# Patient Record
Sex: Female | Born: 1980 | Race: Black or African American | Hispanic: No | Marital: Single | Smoking: Never smoker
Health system: Southern US, Community
[De-identification: ages and names within clinical notes are randomized; demographics above are authoritative.]

## PROBLEM LIST (undated history)

## (undated) ENCOUNTER — Inpatient Hospital Stay (HOSPITAL_COMMUNITY): Payer: Self-pay

## (undated) DIAGNOSIS — D219 Benign neoplasm of connective and other soft tissue, unspecified: Secondary | ICD-10-CM

## (undated) HISTORY — PX: NO PAST SURGERIES: SHX2092

---

## 2012-07-30 ENCOUNTER — Emergency Department (HOSPITAL_COMMUNITY)
Admission: EM | Admit: 2012-07-30 | Discharge: 2012-07-31 | Disposition: A | Discharge status: Home / Self Care | Payer: Medicaid Other | Attending: Emergency Medicine | Admitting: Emergency Medicine

## 2012-07-30 ENCOUNTER — Encounter (HOSPITAL_COMMUNITY): Payer: Self-pay | Admitting: *Deleted

## 2012-07-30 DIAGNOSIS — O468X9 Other antepartum hemorrhage, unspecified trimester: Secondary | ICD-10-CM | POA: Insufficient documentation

## 2012-07-30 DIAGNOSIS — N939 Abnormal uterine and vaginal bleeding, unspecified: Secondary | ICD-10-CM

## 2012-07-30 DIAGNOSIS — O9989 Other specified diseases and conditions complicating pregnancy, childbirth and the puerperium: Secondary | ICD-10-CM | POA: Insufficient documentation

## 2012-07-30 DIAGNOSIS — Z3490 Encounter for supervision of normal pregnancy, unspecified, unspecified trimester: Secondary | ICD-10-CM

## 2012-07-30 DIAGNOSIS — R109 Unspecified abdominal pain: Secondary | ICD-10-CM | POA: Insufficient documentation

## 2012-07-30 DIAGNOSIS — R82998 Other abnormal findings in urine: Secondary | ICD-10-CM | POA: Insufficient documentation

## 2012-07-30 DIAGNOSIS — R8271 Bacteriuria: Secondary | ICD-10-CM

## 2012-07-30 DIAGNOSIS — Z3201 Encounter for pregnancy test, result positive: Secondary | ICD-10-CM | POA: Insufficient documentation

## 2012-07-30 LAB — CBC WITH DIFFERENTIAL/PLATELET
HCT: 33.4 % — ABNORMAL LOW (ref 36.0–46.0)
Hemoglobin: 11.9 g/dL — ABNORMAL LOW (ref 12.0–15.0)
Lymphs Abs: 2.2 10*3/uL (ref 0.7–4.0)
MCHC: 35.6 g/dL (ref 30.0–36.0)
Monocytes Absolute: 0.6 10*3/uL (ref 0.1–1.0)
Monocytes Relative: 10 % (ref 3–12)
Neutro Abs: 3.7 10*3/uL (ref 1.7–7.7)
Neutrophils Relative %: 57 % (ref 43–77)
RBC: 3.79 MIL/uL — ABNORMAL LOW (ref 3.87–5.11)

## 2012-07-30 LAB — URINALYSIS, ROUTINE W REFLEX MICROSCOPIC
Glucose, UA: NEGATIVE mg/dL
Ketones, ur: NEGATIVE mg/dL
Nitrite: NEGATIVE
Protein, ur: NEGATIVE mg/dL
Urobilinogen, UA: 1 mg/dL (ref 0.0–1.0)

## 2012-07-30 LAB — COMPREHENSIVE METABOLIC PANEL
Alkaline Phosphatase: 58 U/L (ref 39–117)
BUN: 7 mg/dL (ref 6–23)
CO2: 23 mEq/L (ref 19–32)
Chloride: 104 mEq/L (ref 96–112)
Creatinine, Ser: 0.64 mg/dL (ref 0.50–1.10)
GFR calc Af Amer: >90 mL/min (ref >90)
GFR calc non Af Amer: >90 mL/min (ref >90)
Glucose, Bld: 99 mg/dL (ref 70–99)
Potassium: 3.6 mEq/L (ref 3.5–5.1)
Total Bilirubin: 0.2 mg/dL — ABNORMAL LOW (ref 0.3–1.2)

## 2012-07-30 LAB — URINE MICROSCOPIC-ADD ON

## 2012-07-30 NOTE — ED Notes (Signed)
The pt has had a cold and cough for 2 weeks and she has also had some minor vaginal spotting for 2 weeks.  lmp oct

## 2012-07-31 ENCOUNTER — Emergency Department (HOSPITAL_COMMUNITY): Payer: Medicaid Other

## 2012-07-31 LAB — ABO/RH: ABO/RH(D): O POS

## 2012-07-31 MED ORDER — NITROFURANTOIN MONOHYD MACRO 100 MG PO CAPS: 100.0000 mg | ORAL_CAPSULE | Freq: Two times a day (BID) | ORAL | Status: DC

## 2012-07-31 MED ORDER — ACETAMINOPHEN 500 MG PO TABS
1000.0000 mg | ORAL_TABLET | Freq: Once | ORAL | Status: AC
  Administered 2012-07-31: 1000 mg via ORAL
  Filled 2012-07-31: qty 2

## 2012-07-31 MED ORDER — ONDANSETRON 4 MG PO TBDP: 4.0000 mg | ORAL_TABLET | Freq: Three times a day (TID) | ORAL | Status: DC | PRN

## 2012-07-31 NOTE — ED Notes (Signed)
Patient transported to Ultrasound with tech

## 2012-07-31 NOTE — ED Provider Notes (Signed)
History     CSN: [id number]  Arrival date & time 07/30/12  2028   First MD Initiated Contact with Patient 07/30/12 2351      Chief Complaint  Patient presents with  . multiple symptoms     (Consider location/radiation/quality/duration/timing/severity/associated sxs/prior treatment) HPI Comments: 31 year old female who is G3 P2 at approximately 6 weeks by dates of last menstrual period presents with lower abdominal cramping, vaginal spotting, intermittent productive cough and a headache. The symptoms have been gradually getting worse over the last 2 weeks, not associated with diarrhea swelling of the legs rashes sore throat nasal congestion sinus pressure ear aches. Nothing seems to make it better or worse. Her 2 prior pregnancies were normal and resulted in vaginal deliveries of healthy children. She was unaware that she was pregnant on her arrival to  The history is provided by the patient and a friend.    History reviewed. No pertinent past medical history.  History reviewed. No pertinent past surgical history.  No family history on file.  History  Substance Use Topics  . Smoking status: Never Smoker   . Smokeless tobacco: Not on file  . Alcohol Use: No    OB History    Grav Para Term Preterm Abortions TAB SAB Ect Mult Living                  Review of Systems  All other systems reviewed and are negative.    Allergies  Review of patient's allergies indicates no known allergies.  Home Medications   Current Outpatient Rx  Name  Route  Sig  Dispense  Refill  . PSEUDOEPH-CPM-DM-APAP 30-2-15-325 MG PO TABS   Oral   Take 1-2 tablets by mouth every 8 (eight) hours as needed. Cold symptoms         . NITROFURANTOIN MONOHYD MACRO 100 MG PO CAPS   Oral   Take 1 capsule (100 mg total) by mouth 2 (two) times daily.   10 capsule   0   . ONDANSETRON 4 MG PO TBDP   Oral   Take 1 tablet (4 mg total) by mouth every 8 (eight) hours as needed for nausea.   10 tablet   0     BP 134/87  Pulse 73  Temp 99.1 F (37.3 C) (Oral)  Resp 14  SpO2 100%  LMP 05/30/2012  Physical Exam  Nursing note and vitals reviewed. Constitutional: She appears well-developed and well-nourished. No distress.  HENT:  Head: Normocephalic and atraumatic.  Mouth/Throat: Oropharynx is clear and moist. No oropharyngeal exudate.  Eyes: Conjunctivae normal and EOM are normal. Pupils are equal, round, and reactive to light. Right eye exhibits no discharge. Left eye exhibits no discharge. No scleral icterus.  Neck: Normal range of motion. Neck supple. No JVD present. No thyromegaly present.  Cardiovascular: Normal rate, regular rhythm, normal heart sounds and intact distal pulses.  Exam reveals no gallop and no friction rub.   No murmur heard. Pulmonary/Chest: Effort normal and breath sounds normal. No respiratory distress. She has no wheezes. She has no rales.  Abdominal: Soft. Bowel sounds are normal. She exhibits no distension and no mass. There is tenderness ( Mild suprapubic and left lower quadrant tenderness, no guarding, no masses, no other tenderness, no peritoneal signs).  Musculoskeletal: Normal range of motion. She exhibits no edema and no tenderness.  Lymphadenopathy:    She has no cervical adenopathy.  Neurological: She is alert. Coordination normal.  Skin: Skin is warm and dry. No rash  noted. No erythema.  Psychiatric: She has a normal mood and affect. Her behavior is normal.    ED Course  Procedures (including critical care time)  Labs Reviewed  CBC WITH DIFFERENTIAL - Abnormal; Notable for the following:    RBC 3.79 (*)     Hemoglobin 11.9 (*)     HCT 33.4 (*)     All other components within normal limits  COMPREHENSIVE METABOLIC PANEL - Abnormal; Notable for the following:    Total Bilirubin 0.2 (*)     All other components within normal limits  URINALYSIS, ROUTINE W REFLEX MICROSCOPIC - Abnormal; Notable for the following:    APPearance CLOUDY (*)      Specific Gravity, Urine 1.034 (*)     Leukocytes, UA MODERATE (*)     All other components within normal limits  PREGNANCY, URINE - Abnormal; Notable for the following:    Preg Test, Ur POSITIVE (*)     All other components within normal limits  URINE MICROSCOPIC-ADD ON - Abnormal; Notable for the following:    Squamous Epithelial / LPF MANY (*)     Bacteria, UA MANY (*)     All other components within normal limits  ABO/RH  URINE CULTURE   US Ob Comp Less 14 Wks  07/31/2012  *RADIOLOGY REPORT*  Clinical Data: Vaginal bleeding.  Estimated gestational age by LMP is 6 weeks 3 days.  Quantitative beta HCG is not available.  OBSTETRIC <14 WK [place] AND TRANSVAGINAL OB US  Technique:  Both transabdominal and transvaginal ultrasound examinations were performed for complete evaluation of the gestation as well as the maternal uterus, adnexal regions, and pelvic cul-de-sac.  Transvaginal technique was performed to assess early pregnancy.  Comparison:  None.  Intrauterine gestational sac:  A single intrauterine pregnancy is visualized. Yolk sac: The yolk sac is present. Embryo: Fetal pole is visualized. Cardiac Activity: Fetal cardiac activity is observed. Heart Rate: 111 bpm  CRL: 3.4  mm  6 w  0 d        [place] EDC: 03/26/2013  Maternal uterus/adnexae: Heterogeneous left posterior myometrial mass measuring about 4 cm diameter suggesting a fibroid.  Cysts in the cervical region consistent with Nabothian cysts.  Right ovary measures 4.1 x 2.9 x 1.9 cm.  Left ovary measures 5.4 x 3.6 x 3.2 cm.  Small cyst on the left ovary.  No abnormal adnexal masses.  Small amount of free fluid in the cul-de-sac.  IMPRESSION: Single intrauterine pregnancy.  Estimated gestational age by crown- rump length is 6-week 0 days.  Uterine fibroid.   Original Report Authenticated By: Burman Nieves, M.D.    US Ob Transvaginal  07/31/2012  *RADIOLOGY REPORT*  Clinical Data: Vaginal bleeding.  Estimated gestational age by LMP is 6 weeks 3  days.  Quantitative beta HCG is not available.  OBSTETRIC <14 WK [place] AND TRANSVAGINAL OB US  Technique:  Both transabdominal and transvaginal ultrasound examinations were performed for complete evaluation of the gestation as well as the maternal uterus, adnexal regions, and pelvic cul-de-sac.  Transvaginal technique was performed to assess early pregnancy.  Comparison:  None.  Intrauterine gestational sac:  A single intrauterine pregnancy is visualized. Yolk sac: The yolk sac is present. Embryo: Fetal pole is visualized. Cardiac Activity: Fetal cardiac activity is observed. Heart Rate: 111 bpm  CRL: 3.4  mm  6 w  0 d        [place] EDC: 03/26/2013  Maternal uterus/adnexae: Heterogeneous left posterior myometrial mass measuring about 4  cm diameter suggesting a fibroid.  Cysts in the cervical region consistent with Nabothian cysts.  Right ovary measures 4.1 x 2.9 x 1.9 cm.  Left ovary measures 5.4 x 3.6 x 3.2 cm.  Small cyst on the left ovary.  No abnormal adnexal masses.  Small amount of free fluid in the cul-de-sac.  IMPRESSION: Single intrauterine pregnancy.  Estimated gestational age by crown- rump length is 6-week 0 days.  Uterine fibroid.   Original Report Authenticated By: Burman Nieves, M.D.      1. Pregnancy   2. Abdominal cramping   3. Vaginal bleeding   4. Bacteriuria       MDM  The patient is clear heart and lungs, normal vital signs without fever or tachycardia or hypoxia. She has mild tenderness in her lower abdomen. I performed a bedside ultrasound as a cursory test to evaluate for intrauterine pregnancy, there appears to be a likely intrauterine pregnancy however patient will be sent for formal ultrasound to rule out ectopic pregnancy. She has stable vital signs and a minimally tender exam, Tylenol for pain  The patient appears well. She has no signs of pneumonia, she has lower abdominal cramping with an intrauterine pregnancy that was seen on ultrasound by the ultrasound technician. It has  been red by the radiologist, there is no signs of tubal pregnancy. Urinalysis shows bacteria but no other signs of infection, Macrobid prescribed for bacteriuria and culture obtained.      Vida Roller, MD 07/31/12 0330

## 2012-08-01 ENCOUNTER — Emergency Department (HOSPITAL_COMMUNITY)
Admission: EM | Admit: 2012-08-01 | Discharge: 2012-08-02 | Disposition: A | Discharge status: Home / Self Care | Payer: Medicaid Other | Attending: Emergency Medicine | Admitting: Emergency Medicine

## 2012-08-01 ENCOUNTER — Encounter (HOSPITAL_COMMUNITY): Payer: Self-pay | Admitting: *Deleted

## 2012-08-01 ENCOUNTER — Emergency Department (HOSPITAL_COMMUNITY): Payer: Medicaid Other

## 2012-08-01 DIAGNOSIS — O200 Threatened abortion: Secondary | ICD-10-CM | POA: Insufficient documentation

## 2012-08-01 LAB — CBC WITH DIFFERENTIAL/PLATELET
Basophils Relative: 0 % (ref 0–1)
Eosinophils Absolute: 0 10*3/uL (ref 0.0–0.7)
HCT: 34 % — ABNORMAL LOW (ref 36.0–46.0)
Hemoglobin: 11.8 g/dL — ABNORMAL LOW (ref 12.0–15.0)
MCH: 30.8 pg (ref 26.0–34.0)
MCHC: 34.7 g/dL (ref 30.0–36.0)
Monocytes Absolute: 0.5 10*3/uL (ref 0.1–1.0)
Monocytes Relative: 9 % (ref 3–12)
Neutrophils Relative %: 59 % (ref 43–77)
RDW: 13.1 % (ref 11.5–15.5)

## 2012-08-01 LAB — URINE CULTURE: Colony Count: 50000

## 2012-08-01 NOTE — ED Notes (Signed)
Pt transported to US

## 2012-08-01 NOTE — ED Notes (Addendum)
Pt reports vaginal bleeding and pregnant.  Denies abdominal cramping.  States that she is 6wk4days today.  Denies abdominal injury, fall, any hit to abdomen.  States that she had blood when she wiped.

## 2012-08-01 NOTE — ED Provider Notes (Signed)
History     CSN: [id number]  Arrival date & time 08/01/12  2038   First MD Initiated Contact with Patient 08/01/12 2139      Chief Complaint  Patient presents with  . Vaginal Bleeding  . Routine Prenatal Visit    (Consider location/radiation/quality/duration/timing/severity/associated sxs/prior treatment) HPI Comments: Had episode of vaginal bleeding with some possible tissue in the blood.  Patient is a 31 y.o. female presenting with vaginal bleeding. The history is provided by the patient.  Vaginal Bleeding This is a new problem. The current episode started today. The problem occurs constantly. The problem has been rapidly improving. Pertinent negatives include no abdominal pain, chills, coughing, fever or vomiting. Nothing aggravates the symptoms. She has tried nothing for the symptoms.    History reviewed. No pertinent past medical history.  History reviewed. No pertinent past surgical history.  History reviewed. No pertinent family history.  History  Substance Use Topics  . Smoking status: Never Smoker   . Smokeless tobacco: Not on file  . Alcohol Use: No    OB History    Grav Para Term Preterm Abortions TAB SAB Ect Mult Living                  Review of Systems  Constitutional: Negative for fever and chills.  Respiratory: Negative for cough and shortness of breath.   Gastrointestinal: Negative for vomiting and abdominal pain.  Genitourinary: Positive for vaginal bleeding.  All other systems reviewed and are negative.    Allergies  Review of patient's allergies indicates no known allergies.  Home Medications   Current Outpatient Rx  Name  Route  Sig  Dispense  Refill  . PSEUDOEPH-CPM-DM-APAP 30-2-15-325 MG PO TABS   Oral   Take 1-2 tablets by mouth every 8 (eight) hours as needed. Cold symptoms           BP 127/75  Pulse 82  Temp 98.8 F (37.1 C) (Oral)  Resp 18  SpO2 100%  LMP 05/30/2012  Physical Exam  Nursing note and vitals  reviewed. Constitutional: She is oriented to person, place, and time. She appears well-developed and well-nourished. No distress.  HENT:  Head: Normocephalic and atraumatic.  Eyes: EOM are normal. Pupils are equal, round, and reactive to light.  Neck: Normal range of motion. Neck supple.  Cardiovascular: Normal rate and regular rhythm.  Exam reveals no friction rub.   No murmur heard. Pulmonary/Chest: Effort normal and breath sounds normal. No respiratory distress. She has no wheezes. She has no rales.  Abdominal: Soft. She exhibits no distension. There is no tenderness. There is no rebound.  Genitourinary: No breast swelling or discharge. Pelvic exam was performed with patient supine. Cervix exhibits discharge (mild, mucus-like) and friability (mild redness to cervical with small circular lesions around perimeter). Cervix exhibits no motion tenderness. Right adnexum displays no mass, no tenderness and no fullness. Left adnexum displays no mass, no tenderness and no fullness. No erythema around the vagina. No signs of injury around the vagina. No vaginal discharge found.  Musculoskeletal: Normal range of motion. She exhibits no edema.  Neurological: She is alert and oriented to person, place, and time.  Skin: She is not diaphoretic.    ED Course  Procedures (including critical care time)  Labs Reviewed  CBC WITH DIFFERENTIAL - Abnormal; Notable for the following:    RBC 3.83 (*)     Hemoglobin 11.8 (*)     HCT 34.0 (*)     All other components within  normal limits   US Ob Comp Less 14 Wks  07/31/2012  *RADIOLOGY REPORT*  Clinical Data: Vaginal bleeding.  Estimated gestational age by LMP is 6 weeks 3 days.  Quantitative beta HCG is not available.  OBSTETRIC <14 WK [place] AND TRANSVAGINAL OB US  Technique:  Both transabdominal and transvaginal ultrasound examinations were performed for complete evaluation of the gestation as well as the maternal uterus, adnexal regions, and pelvic cul-de-sac.   Transvaginal technique was performed to assess early pregnancy.  Comparison:  None.  Intrauterine gestational sac:  A single intrauterine pregnancy is visualized. Yolk sac: The yolk sac is present. Embryo: Fetal pole is visualized. Cardiac Activity: Fetal cardiac activity is observed. Heart Rate: 111 bpm  CRL: 3.4  mm  6 w  0 d        [place] EDC: 03/26/2013  Maternal uterus/adnexae: Heterogeneous left posterior myometrial mass measuring about 4 cm diameter suggesting a fibroid.  Cysts in the cervical region consistent with Nabothian cysts.  Right ovary measures 4.1 x 2.9 x 1.9 cm.  Left ovary measures 5.4 x 3.6 x 3.2 cm.  Small cyst on the left ovary.  No abnormal adnexal masses.  Small amount of free fluid in the cul-de-sac.  IMPRESSION: Single intrauterine pregnancy.  Estimated gestational age by crown- rump length is 6-week 0 days.  Uterine fibroid.   Original Report Authenticated By: Burman Nieves, M.D.    US Ob Transvaginal  07/31/2012  *RADIOLOGY REPORT*  Clinical Data: Vaginal bleeding.  Estimated gestational age by LMP is 6 weeks 3 days.  Quantitative beta HCG is not available.  OBSTETRIC <14 WK [place] AND TRANSVAGINAL OB US  Technique:  Both transabdominal and transvaginal ultrasound examinations were performed for complete evaluation of the gestation as well as the maternal uterus, adnexal regions, and pelvic cul-de-sac.  Transvaginal technique was performed to assess early pregnancy.  Comparison:  None.  Intrauterine gestational sac:  A single intrauterine pregnancy is visualized. Yolk sac: The yolk sac is present. Embryo: Fetal pole is visualized. Cardiac Activity: Fetal cardiac activity is observed. Heart Rate: 111 bpm  CRL: 3.4  mm  6 w  0 d        [place] EDC: 03/26/2013  Maternal uterus/adnexae: Heterogeneous left posterior myometrial mass measuring about 4 cm diameter suggesting a fibroid.  Cysts in the cervical region consistent with Nabothian cysts.  Right ovary measures 4.1 x 2.9 x 1.9 cm.  Left ovary  measures 5.4 x 3.6 x 3.2 cm.  Small cyst on the left ovary.  No abnormal adnexal masses.  Small amount of free fluid in the cul-de-sac.  IMPRESSION: Single intrauterine pregnancy.  Estimated gestational age by crown- rump length is 6-week 0 days.  Uterine fibroid.   Original Report Authenticated By: Burman Nieves, M.D.      1. Threatened abortion       MDM   31 year old female presents with vaginal bleeding. Just found out 2 days ago that she had an intrauterine pregnancy confirmed on transvaginal ultrasound. Today she came home from shopping and had episode of vaginal bleeding. Patient says she had material that resembled tissue. She has had the bleeding almost ceased with just very minimal spotting at this time. She does not have any belly pain. She does not have any vaginal discharge. On exam, she has mild mucousy cervical discharge. Her cervix is closed on visual inspection and on manual exam. Patient likely has complete abortion, however cannot rule out threatened miscarriage. Will repeat another transvaginal ultrasound at  this time to confirm if patient still has IUP.  TVUS confirms IUP. Patient instructed to f/u with OB as planned. Patient states she has numbers to call and does not need a referral. Patient discharged home in stable condition.        Elwin Mocha, MD 08/02/12 [phone]

## 2012-08-01 NOTE — ED Notes (Signed)
Pelvic exam preformed

## 2012-08-05 NOTE — ED Provider Notes (Signed)
I saw and evaluated the patient, reviewed the resident's note and I agree with the findings and plan.  Toy Baker, MD 08/05/12 [phone]

## 2012-08-18 NOTE — L&D Delivery Note (Signed)
Delivery Note At 10:08 PM a viable female was delivered via Vaginal, Spontaneous Delivery (Presentation: Left Occiput Anterior).  APGAR: 9, 9; weight .   Placenta status: Intact, Spontaneous.  Cord: 3 vessels with the following complications: None.  C  Anesthesia: None  Episiotomy: None Lacerations: None Suture Repair: none Est. Blood Loss (mL): 300  Mom to postpartum.  Baby to nursery-stable.  Jolyn Lent, RYAN 03/24/2013, 10:40 PM

## 2012-09-01 ENCOUNTER — Other Ambulatory Visit (HOSPITAL_COMMUNITY): Payer: Self-pay | Admitting: Physician Assistant

## 2012-09-01 DIAGNOSIS — Z3682 Encounter for antenatal screening for nuchal translucency: Secondary | ICD-10-CM

## 2012-09-01 LAB — OB RESULTS CONSOLE HEPATITIS B SURFACE ANTIGEN: Hepatitis B Surface Ag: NEGATIVE

## 2012-09-01 LAB — OB RESULTS CONSOLE ABO/RH

## 2012-09-01 LAB — OB RESULTS CONSOLE ANTIBODY SCREEN: Antibody Screen: NEGATIVE

## 2012-09-01 LAB — OB RESULTS CONSOLE RUBELLA ANTIBODY, IGM: Rubella: IMMUNE

## 2012-09-08 ENCOUNTER — Ambulatory Visit (HOSPITAL_COMMUNITY): Payer: Medicaid Other

## 2012-09-16 ENCOUNTER — Ambulatory Visit (HOSPITAL_COMMUNITY)
Admission: RE | Admit: 2012-09-16 | Discharge: 2012-09-16 | Disposition: A | Discharge status: Home / Self Care | Payer: Medicaid Other | Source: Ambulatory Visit | Attending: Physician Assistant | Admitting: Physician Assistant

## 2012-09-16 ENCOUNTER — Encounter (HOSPITAL_COMMUNITY): Payer: Self-pay

## 2012-09-16 ENCOUNTER — Other Ambulatory Visit: Payer: Self-pay

## 2012-09-16 DIAGNOSIS — O351XX0 Maternal care for (suspected) chromosomal abnormality in fetus, not applicable or unspecified: Secondary | ICD-10-CM | POA: Insufficient documentation

## 2012-09-16 DIAGNOSIS — Z3682 Encounter for antenatal screening for nuchal translucency: Secondary | ICD-10-CM

## 2012-09-16 DIAGNOSIS — Z3689 Encounter for other specified antenatal screening: Secondary | ICD-10-CM | POA: Insufficient documentation

## 2012-09-16 DIAGNOSIS — O3510X0 Maternal care for (suspected) chromosomal abnormality in fetus, unspecified, not applicable or unspecified: Secondary | ICD-10-CM | POA: Insufficient documentation

## 2012-09-16 DIAGNOSIS — O3410 Maternal care for benign tumor of corpus uteri, unspecified trimester: Secondary | ICD-10-CM | POA: Insufficient documentation

## 2012-09-16 NOTE — Progress Notes (Signed)
Dominique Clements  was seen today for an ultrasound appointment.  See full report in [facility].  Impression: Single IUP at 13 3/7 weeks NT of 1.5 mm noted.  Nasal bone visualized. Several uterine fibroids noted, as detailed above. First trimester aneuploidy screen performed as noted above.   Recommendations: Please do not draw triple/quad screen, though patient should be offered MSAFP for neural tube defect screening.  Recommend ultrasound for fetal anatomy at approx [redacted] weeks gestation.  Alpha Gula, MD

## 2012-09-29 ENCOUNTER — Other Ambulatory Visit (HOSPITAL_COMMUNITY): Payer: Self-pay | Admitting: Nurse Practitioner

## 2012-09-29 DIAGNOSIS — Z3689 Encounter for other specified antenatal screening: Secondary | ICD-10-CM

## 2012-10-27 ENCOUNTER — Ambulatory Visit (HOSPITAL_COMMUNITY)
Admission: RE | Admit: 2012-10-27 | Discharge: 2012-10-27 | Disposition: A | Discharge status: Home / Self Care | Payer: Medicaid Other | Source: Ambulatory Visit | Attending: Nurse Practitioner | Admitting: Nurse Practitioner

## 2012-10-27 DIAGNOSIS — Z363 Encounter for antenatal screening for malformations: Secondary | ICD-10-CM | POA: Insufficient documentation

## 2012-10-27 DIAGNOSIS — O358XX0 Maternal care for other (suspected) fetal abnormality and damage, not applicable or unspecified: Secondary | ICD-10-CM | POA: Insufficient documentation

## 2012-10-27 DIAGNOSIS — Z1389 Encounter for screening for other disorder: Secondary | ICD-10-CM | POA: Insufficient documentation

## 2012-10-27 DIAGNOSIS — O3410 Maternal care for benign tumor of corpus uteri, unspecified trimester: Secondary | ICD-10-CM | POA: Insufficient documentation

## 2013-03-14 IMAGING — US US OB DETAIL+14 WK
1 series · 12 of 28 positions shown · non-contrast
Comparison: none

[Series 1: us ob detail +14 wk · 12 of 102 slices shown]
[im 4/102]
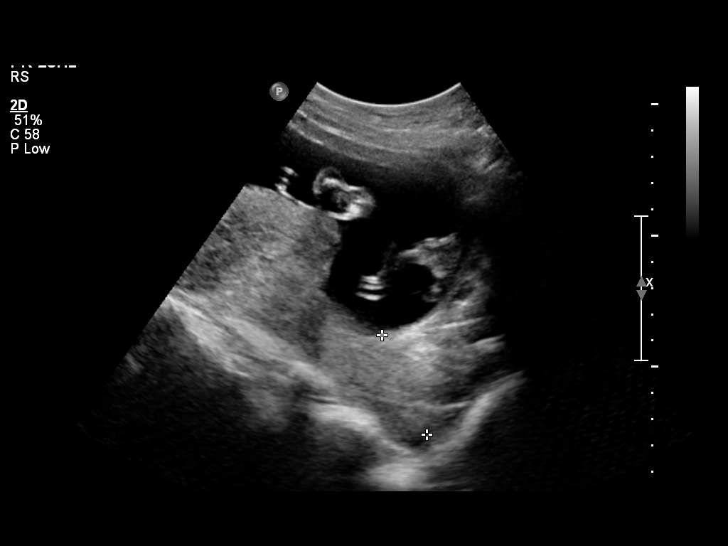
[im 12/102]
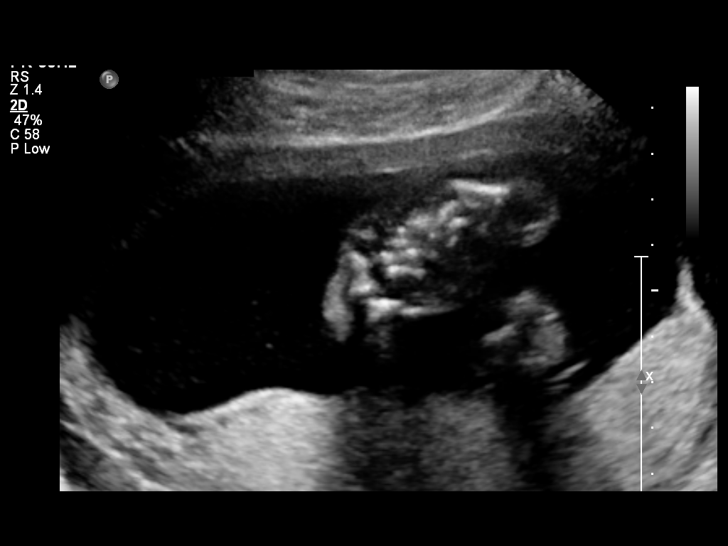
[im 19/102]
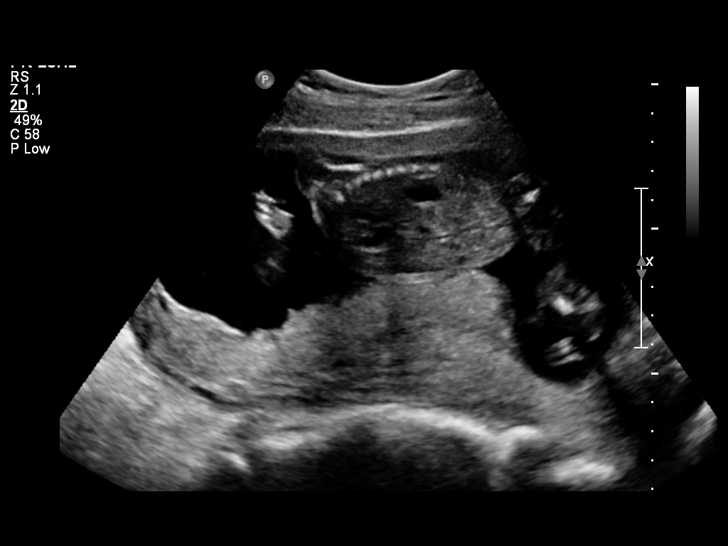
[im 30/102]
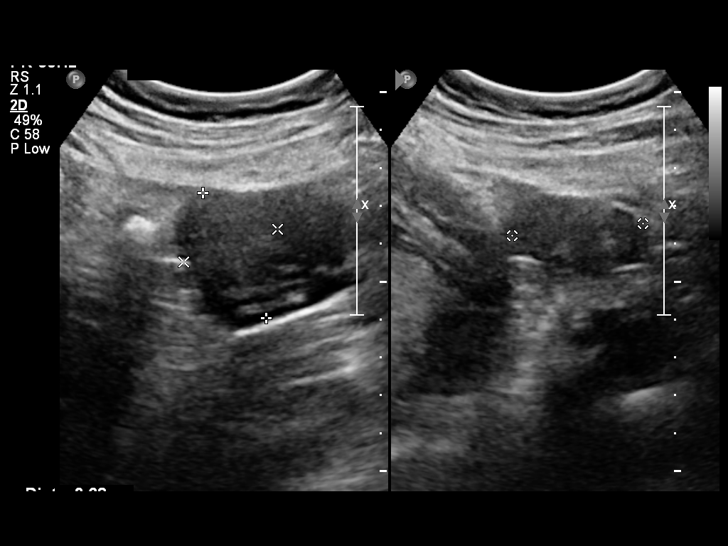
[im 38/102]
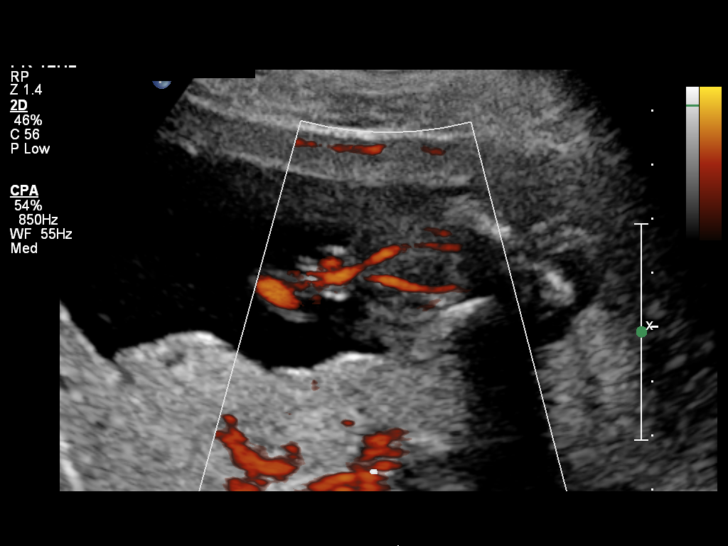
[im 45/102]
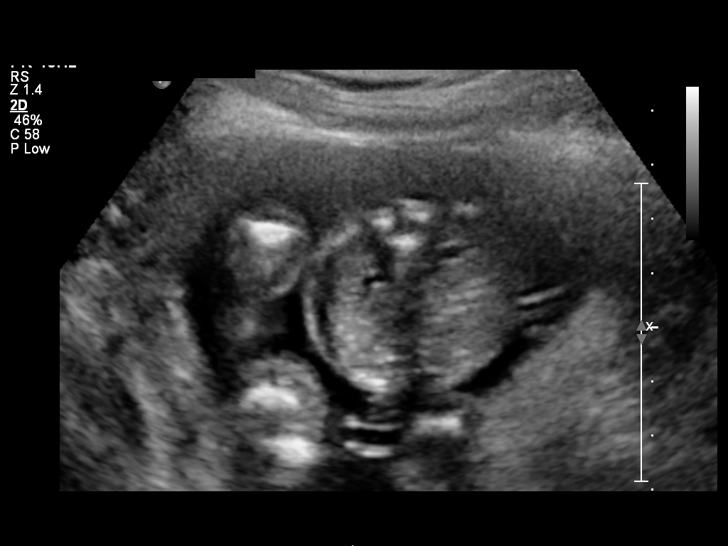
[im 57/102]
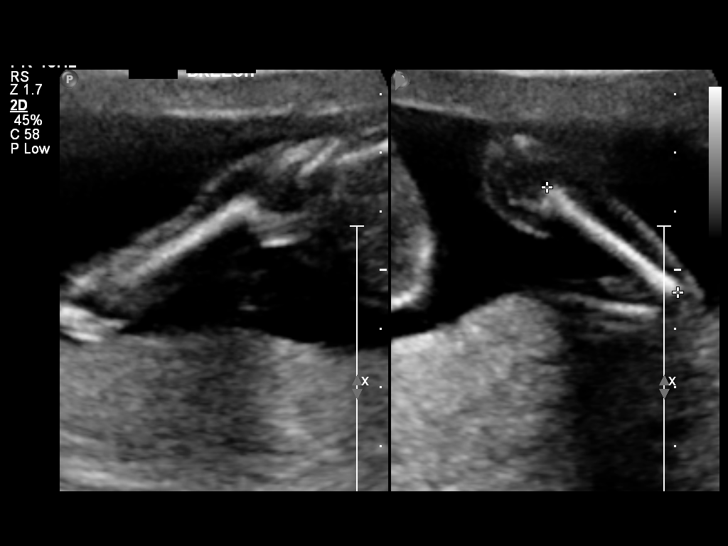
[im 64/102]
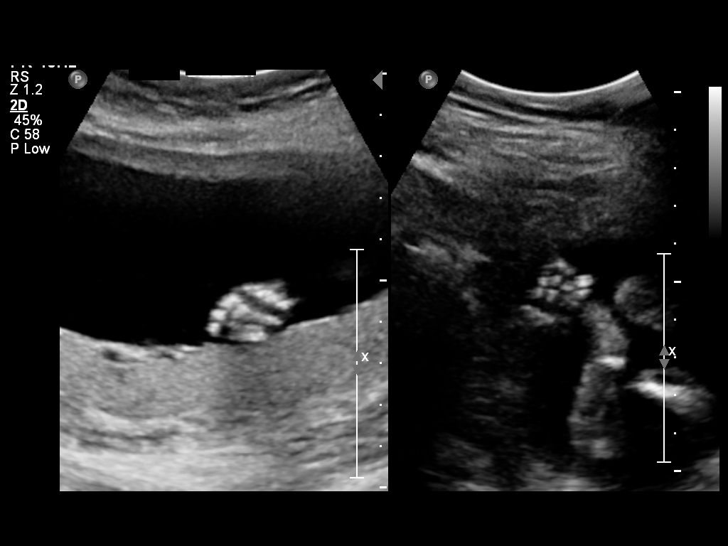
[im 72/102]
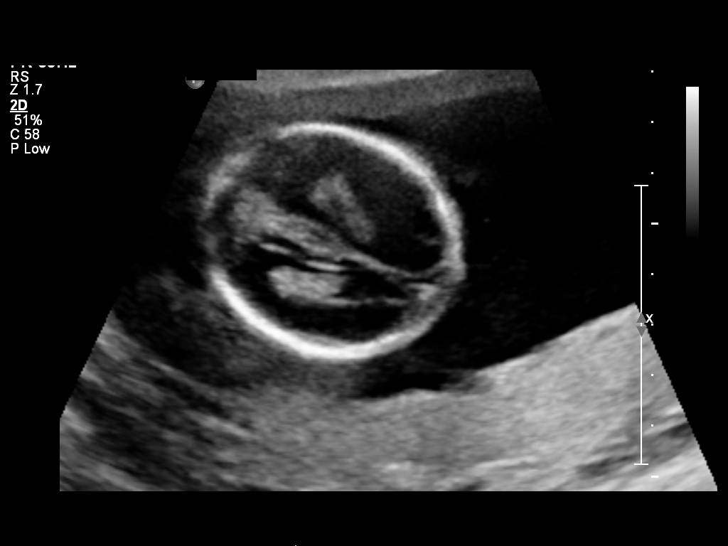
[im 83/102]
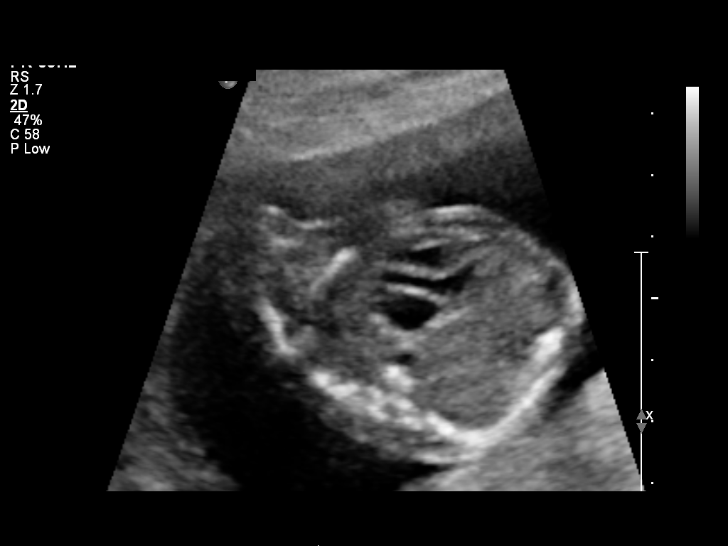
[im 90/102]
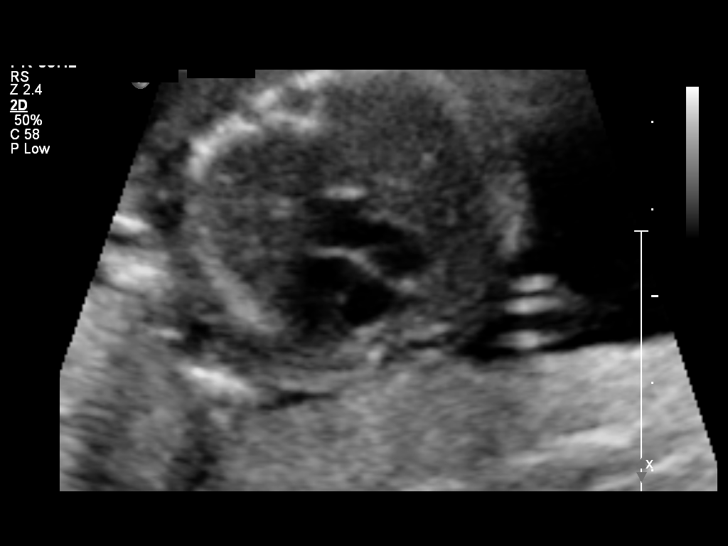
[im 98/102]
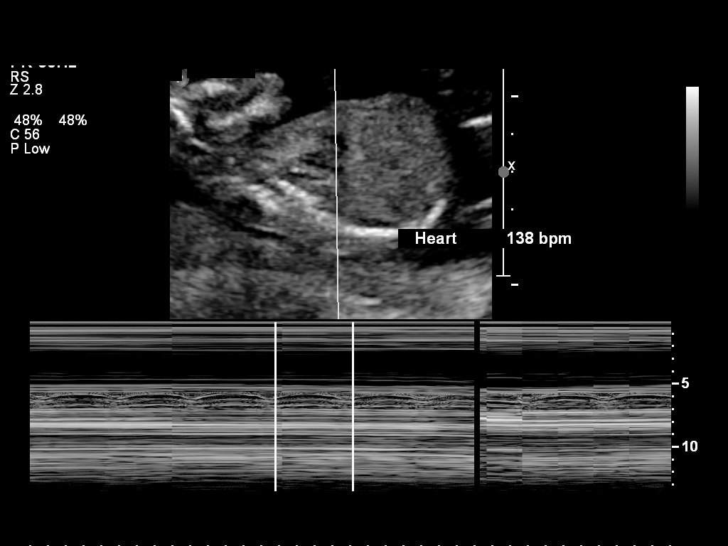

[12 of 28 positions shown; findings below may reference images not displayed]

OBSTETRICS REPORT
                      (Signed Final 10/27/2012 [DATE])

Service(s) Provided

 US OB DETAIL + 14 WK                                  76811.0
Indications

 Detailed fetal anatomic survey
 Uterine fibroids
Fetal Evaluation

 Num Of Fetuses:    1
 Fetal Heart Rate:  138                         bpm
 Cardiac Activity:  Observed
 Presentation:      Breech, footling
 Placenta:          Posterior, above cervical
                    os
 P. Cord            Visualized, central
 Insertion:

 Amniotic Fluid
 AFI FV:      Subjectively within normal limits
                                             Larg Pckt:   4.89   cm
Biometry

 BPD:     43.3  mm    G. Age:   19w 1d                CI:        72.08   70 - 86
                                                      FL/HC:      18.2   16.1 -

 HC:     162.3  mm    G. Age:   19w 0d       30  %    HC/AC:      1.16   1.09 -

 AC:     139.6  mm    G. Age:   19w 3d       48  %    FL/BPD:
 FL:      29.6  mm    G. Age:   19w 1d       37  %    FL/AC:      21.2   20 - 24
 HUM:     29.1  mm    G. Age:   19w 3d       56  %
 CER:     19.3  mm    G. Age:   18w 5d       34  %
 NFT:     4.76  mm

 Est. FW:     280  gm    0 lb 10 oz      44  %
Gestational Age

 LMP:           19w 2d       Date:   06/14/12                 EDD:   03/21/13
 U/S Today:     19w 1d                                        EDD:   03/22/13
 Best:          19w 2d    Det. By:   LMP  (06/14/12)          EDD:   03/21/13
2nd Trimester Genetic Sonogram - Trisomy 21 Screening
 Age:                                             32          Risk=1:   303

 Structural anomalies (inc. cardiac):             No
 Echogenic bowel:                                 No
 Hypoplastic / absent midphalanx 5th Digit:       No
 Pyelectasis:                                     No
 2-vessel umbilical cord:                         No
 Echogenic cardiac foci:                          No
Anatomy

 Cranium:          Appears normal         Aortic Arch:      Appears normal
 Fetal Cavum:      Appears normal         Ductal Arch:      Appears normal
 Ventricles:       Appears normal         Diaphragm:        Appears normal
 Choroid Plexus:   Appears normal         Stomach:          Appears normal
 Cerebellum:       Appears normal         Abdomen:          Appears normal
 Posterior Fossa:  Appears normal         Abdominal Wall:   Appears nml (cord
                                                            insert, abd wall)
 Nuchal Fold:      Appears normal         Cord Vessels:     Appears normal (3
                                                            vessel cord)
 Face:             Appears normal         Kidneys:          Appear normal
                   (orbits and profile)
 Lips:             Appears normal         Bladder:          Appears normal
 Heart:            Appears normal         Spine:            Appears normal
                   (4CH, axis, and
                   situs)
 RVOT:             Appears normal         Lower             Appears normal
                                          Extremities:
 LVOT:             Appears normal         Upper             Appears normal
                                          Extremities:

 Other:  Fetus appears to be a male. Heels and 5th digit visualized. Nasal
         bone visualized.
Targeted Anatomy

 Fetal Central Nervous System
 Lat. Ventricles:  6.9                    Cisterna Magna:
Cervix Uterus Adnexa

 Cervical Length:   3.93      cm

 Cervix:       Normal appearance by transabdominal scan.
 Uterus:       No abnormality visualized.
 Cul De Sac:   No free fluid seen.

 Left Ovary:   Within normal limits.
 Right Ovary:  Within normal limits.
 Adnexa:     No abnormality visualized.
Myomas

 Site                     L(cm)      W(cm)      D(cm)       Location
 Posterior LUS

 Blood Flow                  RI       PI       Comments
Impression

 Assigned GA is currently 19w 2d.   Appropriate interval fetal
 growth.
 No fetal anomalies seen involving visualized anatomy.
 No sonographic markers for aneuploidy visualized.
 6cm fibroid in posterior lower uterine segment.

## 2013-03-18 ENCOUNTER — Encounter (HOSPITAL_COMMUNITY): Payer: Self-pay | Admitting: *Deleted

## 2013-03-18 ENCOUNTER — Inpatient Hospital Stay (HOSPITAL_COMMUNITY)
Admission: AD | Admit: 2013-03-18 | Discharge: 2013-03-18 | Disposition: A | Discharge status: Home / Self Care | Payer: Medicaid Other | Source: Ambulatory Visit | Attending: Obstetrics & Gynecology | Admitting: Obstetrics & Gynecology

## 2013-03-18 DIAGNOSIS — O368190 Decreased fetal movements, unspecified trimester, not applicable or unspecified: Secondary | ICD-10-CM | POA: Insufficient documentation

## 2013-03-18 NOTE — MAU Provider Note (Signed)
  History     CSN: [id number]  Arrival date and time: 03/18/13 7829   None     Chief Complaint  Patient presents with  . Decreased Fetal Movement   HPI This is a 32 y.o. female at [redacted]w[redacted]d who presents with report of decreased fetal movement.  States movement has felt more faint than usual.   OB History   Grav Para Term Preterm Abortions TAB SAB Ect Mult Living   4 2 2  0 1 0 1 0 0 2      Past Medical History  Diagnosis Date  . Medical history non-contributory     Past Surgical History  Procedure Laterality Date  . No past surgeries      History reviewed. No pertinent family history.  History  Substance Use Topics  . Smoking status: Never Smoker   . Smokeless tobacco: Not on file  . Alcohol Use: No    Allergies: No Known Allergies  Prescriptions prior to admission  Medication Sig Dispense Refill  . Prenatal Vit-Fe Fumarate-FA (MULTIVITAMIN-PRENATAL) 27-0.8 MG TABS Take 1 tablet by mouth daily at 12 noon.      . Pseudoeph-CPM-DM-APAP (TYLENOL COLD) 30-2-15-325 MG TABS Take 1-2 tablets by mouth every 8 (eight) hours as needed. Cold symptoms        Review of Systems  Constitutional: Negative for fever.  Gastrointestinal: Negative for nausea, vomiting and abdominal pain.  Genitourinary: Negative for dysuria.  Neurological: Negative for dizziness and headaches.   Physical Exam   Blood pressure 110/70, pulse 80, temperature 98.2 F (36.8 C), temperature source Oral, resp. rate 18, height 5' 8.5" (1.74 m), weight 86.183 kg (190 lb), last menstrual period 06/14/2012, SpO2 100.00%.  Physical Exam  Constitutional: She is oriented to person, place, and time. She appears well-developed and well-nourished. No distress.  Cardiovascular: Normal rate.   Respiratory: Effort normal.  GI: Soft. There is no tenderness.  Musculoskeletal: Normal range of motion.  Neurological: She is alert and oriented to person, place, and time.  Skin: Skin is warm and dry.  Psychiatric: She  has a normal mood and affect.   FHR reactive, 130s, good accels Uterine irritability noted  MAU Course  Procedures   Assessment and Plan  A:  SIUP at [redacted]w[redacted]d       Decreased perception of fetal movement      Reactive Fetal Heart Rate tracing  P:  Reassured patient that tracing is reassuring       Labor precautions  [facility] 03/18/2013, 7:10 AM

## 2013-03-18 NOTE — MAU Note (Signed)
Pt reports decreased fetal movement since yesterday. Very "faint" movements today.

## 2013-03-20 NOTE — MAU Provider Note (Signed)
Attestation of Attending Supervision of Advanced Practitioner (PA/CNM/NP): Evaluation and management procedures were performed by the Advanced Practitioner under my supervision and collaboration.  I have reviewed the Advanced Practitioner's note and chart, and I agree with the management and plan.  Naylene Foell, MD, FACOG Attending Obstetrician & Gynecologist Faculty Practice, [facility]  

## 2013-03-24 ENCOUNTER — Encounter (HOSPITAL_COMMUNITY): Payer: Self-pay | Admitting: *Deleted

## 2013-03-24 ENCOUNTER — Inpatient Hospital Stay (HOSPITAL_COMMUNITY)
Admission: AD | Admit: 2013-03-24 | Discharge: 2013-03-26 | DRG: 775 | Disposition: A | Discharge status: Home / Self Care | Payer: Medicaid Other | Source: Ambulatory Visit | Attending: Family Medicine | Admitting: Family Medicine

## 2013-03-24 ENCOUNTER — Inpatient Hospital Stay (HOSPITAL_COMMUNITY)
Admission: AD | Admit: 2013-03-24 | Discharge: 2013-03-24 | Disposition: A | Discharge status: Home / Self Care | Payer: Medicaid Other | Source: Ambulatory Visit | Attending: Obstetrics & Gynecology | Admitting: Obstetrics & Gynecology

## 2013-03-24 DIAGNOSIS — O479 False labor, unspecified: Secondary | ICD-10-CM

## 2013-03-24 DIAGNOSIS — Z3490 Encounter for supervision of normal pregnancy, unspecified, unspecified trimester: Secondary | ICD-10-CM

## 2013-03-24 DIAGNOSIS — O471 False labor at or after 37 completed weeks of gestation: Secondary | ICD-10-CM

## 2013-03-24 HISTORY — DX: Benign neoplasm of connective and other soft tissue, unspecified: D21.9

## 2013-03-24 LAB — CBC
HCT: 35.6 % — ABNORMAL LOW (ref 36.0–46.0)
Hemoglobin: 12.3 g/dL (ref 12.0–15.0)
MCV: 91.5 fL (ref 78.0–100.0)
RBC: 3.89 MIL/uL (ref 3.87–5.11)
WBC: 10 10*3/uL (ref 4.0–10.5)

## 2013-03-24 LAB — TYPE AND SCREEN: Antibody Screen: NEGATIVE

## 2013-03-24 MED ORDER — DIBUCAINE 1 % RE OINT: 1.0000 "application " | TOPICAL_OINTMENT | RECTAL | Status: DC | PRN

## 2013-03-24 MED ORDER — ONDANSETRON HCL 4 MG/2ML IJ SOLN: 4.0000 mg | Freq: Four times a day (QID) | INTRAMUSCULAR | Status: DC | PRN

## 2013-03-24 MED ORDER — LACTATED RINGERS IV SOLN: 500.0000 mL | INTRAVENOUS | Status: DC | PRN

## 2013-03-24 MED ORDER — TETANUS-DIPHTH-ACELL PERTUSSIS 5-2.5-18.5 LF-MCG/0.5 IM SUSP: 0.5000 mL | Freq: Once | INTRAMUSCULAR | Status: DC

## 2013-03-24 MED ORDER — DIPHENHYDRAMINE HCL 25 MG PO CAPS: 25.0000 mg | ORAL_CAPSULE | Freq: Four times a day (QID) | ORAL | Status: DC | PRN

## 2013-03-24 MED ORDER — IBUPROFEN 600 MG PO TABS
600.0000 mg | ORAL_TABLET | Freq: Four times a day (QID) | ORAL | Status: DC | PRN
  Administered 2013-03-24: 600 mg via ORAL
  Filled 2013-03-24: qty 1

## 2013-03-24 MED ORDER — OXYCODONE-ACETAMINOPHEN 5-325 MG PO TABS: 1.0000 | ORAL_TABLET | ORAL | Status: DC | PRN

## 2013-03-24 MED ORDER — IBUPROFEN 600 MG PO TABS
600.0000 mg | ORAL_TABLET | Freq: Four times a day (QID) | ORAL | Status: DC
  Administered 2013-03-25 – 2013-03-26 (×6): 600 mg via ORAL
  Filled 2013-03-24 (×6): qty 1

## 2013-03-24 MED ORDER — SIMETHICONE 80 MG PO CHEW: 80.0000 mg | CHEWABLE_TABLET | ORAL | Status: DC | PRN

## 2013-03-24 MED ORDER — ONDANSETRON HCL 4 MG/2ML IJ SOLN: 4.0000 mg | INTRAMUSCULAR | Status: DC | PRN

## 2013-03-24 MED ORDER — SENNOSIDES-DOCUSATE SODIUM 8.6-50 MG PO TABS
2.0000 | ORAL_TABLET | Freq: Every day | ORAL | Status: DC
  Administered 2013-03-25: 2 via ORAL

## 2013-03-24 MED ORDER — OXYTOCIN 40 UNITS IN LACTATED RINGERS INFUSION - SIMPLE MED
62.5000 mL/h | INTRAVENOUS | Status: DC
  Administered 2013-03-24: 999 mL/h via INTRAVENOUS
  Filled 2013-03-24: qty 1000

## 2013-03-24 MED ORDER — WITCH HAZEL-GLYCERIN EX PADS: 1.0000 "application " | MEDICATED_PAD | CUTANEOUS | Status: DC | PRN

## 2013-03-24 MED ORDER — ZOLPIDEM TARTRATE 5 MG PO TABS: 5.0000 mg | ORAL_TABLET | Freq: Every evening | ORAL | Status: DC | PRN

## 2013-03-24 MED ORDER — FENTANYL CITRATE 0.05 MG/ML IJ SOLN: 100.0000 ug | INTRAMUSCULAR | Status: DC | PRN

## 2013-03-24 MED ORDER — CITRIC ACID-SODIUM CITRATE 334-500 MG/5ML PO SOLN: 30.0000 mL | ORAL | Status: DC | PRN

## 2013-03-24 MED ORDER — ACETAMINOPHEN 325 MG PO TABS: 650.0000 mg | ORAL_TABLET | ORAL | Status: DC | PRN

## 2013-03-24 MED ORDER — OXYTOCIN BOLUS FROM INFUSION: 500.0000 mL | INTRAVENOUS | Status: DC

## 2013-03-24 MED ORDER — PRENATAL MULTIVITAMIN CH
1.0000 | ORAL_TABLET | Freq: Every day | ORAL | Status: DC
  Administered 2013-03-25 – 2013-03-26 (×2): 1 via ORAL
  Filled 2013-03-24 (×2): qty 1

## 2013-03-24 MED ORDER — BENZOCAINE-MENTHOL 20-0.5 % EX AERO: 1.0000 "application " | INHALATION_SPRAY | CUTANEOUS | Status: DC | PRN

## 2013-03-24 MED ORDER — LANOLIN HYDROUS EX OINT: TOPICAL_OINTMENT | CUTANEOUS | Status: DC | PRN

## 2013-03-24 MED ORDER — LACTATED RINGERS IV SOLN
INTRAVENOUS | Status: DC
  Administered 2013-03-24: 22:00:00 via INTRAVENOUS

## 2013-03-24 MED ORDER — ONDANSETRON HCL 4 MG PO TABS: 4.0000 mg | ORAL_TABLET | ORAL | Status: DC | PRN

## 2013-03-24 MED ORDER — LIDOCAINE HCL (PF) 1 % IJ SOLN
30.0000 mL | INTRAMUSCULAR | Status: DC | PRN
  Filled 2013-03-24 (×2): qty 30

## 2013-03-24 NOTE — MAU Provider Note (Signed)
Chief Complaint:  Labor Eval  First Provider Initiated Contact with Patient 03/24/13 [phone]     HPI: Dominique Clements is a 32 y.o. [id number] at [redacted]w[redacted]d who presents to maternity admissions reporting labor. Denies leakage of fluid or vaginal bleeding. Good fetal movement.   Pregnancy Course: Uncomplicated. Receives care at [facility].  Past Medical History: Past Medical History  Diagnosis Date  . Medical history non-contributory     Past obstetric history: OB History   Grav Para Term Preterm Abortions TAB SAB Ect Mult Living   4 2 2  0 1 0 1 0 0 2     # Outc Date GA Lbr Len/2nd Wgt Sex Del Anes PTL Lv   1 TRM            2 TRM            3 SAB            4 CUR               Past Surgical History: Past Surgical History  Procedure Laterality Date  . No past surgeries      Family History: No family history on file.  Social History: History  Substance Use Topics  . Smoking status: Never Smoker   . Smokeless tobacco: Not on file  . Alcohol Use: No    Allergies: No Known Allergies  Meds:  Prescriptions prior to admission  Medication Sig Dispense Refill  . Prenatal Vit-Fe Fumarate-FA (MULTIVITAMIN-PRENATAL) 27-0.8 MG TABS Take 1 tablet by mouth daily at 12 noon.        ROS: Pertinent findings in history of present illness.  Physical Exam  Blood pressure 130/78, pulse 86, temperature 98.2 F (36.8 C), temperature source Oral, resp. rate 20, height 5' 8.5" (1.74 m), weight 87.544 kg (193 lb), last menstrual period 06/14/2012, SpO2 100.00%. GENERAL: Well-developed, well-nourished female in moderate distress.  HEENT: normocephalic HEART: normal rate RESP: normal effort ABDOMEN: Soft, non-tender, gravid appropriate for gestational age EXTREMITIES: Nontender, no edema NEURO: alert and oriented SPECULUM EXAM: Deferred.   9811: Cervix: 3/long  0823: Dilation: 4 Effacement (%): 50 Cervical Position: Middle Station: -2 Presentation: Vertex Exam by:: Dorathy Kinsman, CNM Scant  bloody show.  FHT:  Baseline 140 , moderate variability, accelerations present, no decelerations Contractions: q 2-7 mins, mild-moderate   Labs: NA  Imaging:  NA   MAU Course:   Assessment: 1. False labor after 37 weeks of gestation without delivery     Plan: Discharge home Labor precautions and fetal kick counts Follow-up Information   Follow up with [facility]ALTH DEPT [place]. (as scheduled)    Contact information:   [address] [place] [address] [phone]      Follow up with [facility] MATERNITY ADMISSIONS. (As needed if symptoms worsen)    Contact information:   [address] [id number] [place] [address] [phone]       Medication List         multivitamin-prenatal 27-0.8 MG Tabs tablet  Take 1 tablet by mouth daily at 12 noon.        [place], [place] 03/24/2013 8:37 AM

## 2013-03-24 NOTE — H&P (Signed)
Miguel Medal is a 32 y.o. female presenting for frequent contractions.   +FM, No LOF, No VB, +CTX  Maternal Medical History:  Reason for admission: Nausea.    OB History   Grav Para Term Preterm Abortions TAB SAB Ect Mult Living   4 2 2  0 1 0 1 0 0 2     Past Medical History  Diagnosis Date  . Medical history non-contributory    Past Surgical History  Procedure Laterality Date  . No past surgeries     Family History: family history is not on file. Social History:  reports that she has never smoked. She has never used smokeless tobacco. She reports that she does not drink alcohol or use illicit drugs.   Prenatal Transfer Tool  Maternal Diabetes: No Genetic Screening: Normal Maternal Ultrasounds/Referrals: Normal Fetal Ultrasounds or other Referrals:  None Maternal Substance Abuse:  No Significant Maternal Medications:  None Significant Maternal Lab Results:  None Other Comments:  None  Review of Systems  Constitutional: Negative for fever and chills.  HENT: Negative for hearing loss.   Eyes: Negative for blurred vision.  Cardiovascular: Negative for chest pain and palpitations.  Gastrointestinal: Negative for heartburn, nausea and vomiting.  Neurological: Negative for dizziness, tingling and headaches.    Dilation:  (SVD) Effacement (%): 100 Station: +1 Exam by:: Hayes Blood pressure 130/60, pulse 103, resp. rate 20, last menstrual period 06/14/2012. Exam Physical Exam  Nursing note and vitals reviewed. Constitutional: She is oriented to person, place, and time. She appears well-developed and well-nourished. No distress.  HENT:  Head: Normocephalic and atraumatic.  Cardiovascular: Normal rate, regular rhythm, normal heart sounds and intact distal pulses.  Exam reveals no gallop and no friction rub.   No murmur heard. Respiratory: Effort normal and breath sounds normal. No respiratory distress. She has no wheezes. She has no rales. She exhibits no  tenderness.  GI: Soft. Bowel sounds are normal. She exhibits no distension and no mass. There is no tenderness. There is no rebound and no guarding.  Musculoskeletal: Normal range of motion. She exhibits no edema and no tenderness.  Neurological: She is alert and oriented to person, place, and time.  Skin: Skin is warm and dry. She is not diaphoretic.    Prenatal labs: ABO, Rh: O/Positive/-- (01/15 0000) Antibody: Negative (01/15 0000) Rubella: Immune (01/15 0000) RPR: Nonreactive (01/15 0000)  HBsAg: Negative (01/15 0000)  HIV: Non-reactive (01/15 0000)  GBS: Negative (07/17 0000)   Assessment/Plan: Ratasha Fabre is a 32 y.o. [id number] at [redacted]w[redacted]d here for labor. LaborAdmitPlan #Labor: active labor, SROM 2130 #Pain: IV pain meds #FWB: Cat I #ID: GBS neg #MOF: Breast #MOC: undecided #Circ: to be done at outside facility    Tawana Scale 03/24/2013, 10:35 PM

## 2013-03-24 NOTE — MAU Note (Signed)
Worsening contractions, some bleeding.

## 2013-03-24 NOTE — MAU Note (Signed)
PT SAYS WHEN SHE LEFT  HERE AT 0930- SHE WENT TO HD-  VE 5 CM   . HAS HURT ALL DAY.  WORSE AT 630PM.    DENIES HSV AND MRSA.

## 2013-03-25 NOTE — Progress Notes (Signed)
UR completed 

## 2013-03-25 NOTE — Progress Notes (Signed)
Post Partum Day #1 Subjective: no complaints, up ad lib and tolerating PO; breastfeeding going well; OCPs for contraception  Objective: Blood pressure 114/69, pulse 88, temperature 98.3 F (36.8 C), temperature source Oral, resp. rate 18, height 5' 8.5" (1.74 m), weight 87.544 kg (193 lb), last menstrual period 06/14/2012, unknown if currently breastfeeding.  Physical Exam:  General: alert, cooperative and no distress Lochia: appropriate Uterine Fundus: firm DVT Evaluation: No evidence of DVT seen on physical exam.   Recent Labs  03/24/13 2200  HGB 12.3  HCT 35.6*    Assessment/Plan: Plan for discharge tomorrow Will rx Micronor at d/c   LOS: 1 day   Cam Hai 03/25/2013, 7:53 AM

## 2013-03-26 MED ORDER — IBUPROFEN 600 MG PO TABS: 600.0000 mg | ORAL_TABLET | Freq: Four times a day (QID) | ORAL | Status: DC

## 2013-03-26 NOTE — Lactation Note (Signed)
This note was copied from the chart of Dominique Clements. Lactation Consultation Note   Brief follow up consult with this mom and baby. This is mom's third time breast feeding. Baby is term, and mom was breast feeding in cradle when I walked into the room. The baby was on shallow, and I enouraged mom to sit with back support, bring the baby to her, and to use cross cradle for the first 2-3 weeks, to maintain a deep latch. Mom took my advise well, and the baby was easy to adjust to a deeper latch. Mom knows to cal lactation for questions/concerns.  Patient Name: Dominique Wren Pryce [id number] Date: 03/26/2013 Reason for consult: Follow-up assessment   Maternal Data    Feeding Feeding Type: Breast Milk Length of feed: 30 min  LATCH Score/Interventions Latch: Grasps breast easily, tongue down, lips flanged, rhythmical sucking.  Audible Swallowing: A few with stimulation  Type of Nipple: Everted at rest and after stimulation  Comfort (Breast/Nipple): Soft / non-tender     Hold (Positioning): Assistance needed to correctly position infant at breast and maintain latch. Intervention(s): Breastfeeding basics reviewed;Support Pillows;Position options  LATCH Score: 8  Lactation Tools Discussed/Used     Consult Status Consult Status: Complete    Alfred Levins 03/26/2013, 10:00 AM

## 2013-03-26 NOTE — Discharge Summary (Signed)
  Obstetric Discharge Summary Reason for Admission: onset of labor Prenatal Procedures: ultrasound Intrapartum Procedures: spontaneous vaginal delivery Postpartum Procedures: none Complications-Operative and Postpartum: none  Delivery Note At 10:08 PM a viable female was delivered via Vaginal, Spontaneous Delivery (Presentation: Left Occiput Anterior).  APGAR: 9, 9; weight 7 lb 9.7 oz (3450 g).   Placenta status: Intact, Spontaneous.  Cord: 3 vessels with the following complications: None.   Anesthesia: None  Episiotomy: None Lacerations: None Suture Repair: n/a Est. Blood Loss (mL): 300  Mom to postpartum.  Baby to nursery-stable.  By Dr. Ike Bene  H/H: Lab Results  Component Value Date/Time   HGB 12.3 03/24/2013 10:00 PM   HCT 35.6* 03/24/2013 10:00 PM      Discharge Diagnoses: Term Pregnancy-delivered  Discharge Information: Date: 02/27/2011 Activity: pelvic rest Diet: routine  Medications: PNV and Ibuprofen Breast feeding:  Yes Condition: stable Instructions: refer to practice specific booklet and refer to handout Discharge to: home      Medication List         ibuprofen 600 MG tablet  Commonly known as:  ADVIL,MOTRIN  Take 1 tablet (600 mg total) by mouth every 6 (six) hours.     multivitamin-prenatal 27-0.8 MG Tabs tablet  Take 1 tablet by mouth daily at 12 noon.       Follow-up Information   Follow up with [facility]ALTH DEPT [place]. Schedule an appointment as soon as possible for a visit in 6 weeks.   Contact information:   [address] [place] [address] [phone]      CRESENZO-DISHMAN,Marybelle Giraldo 03/26/2013,7:18 AM

## 2013-03-27 NOTE — Discharge Summary (Signed)
Attestation of Attending Supervision of Advanced Practitioner: Evaluation and management procedures were performed by the PA/NP/CNM/OB Fellow under my supervision/collaboration. Chart reviewed and agree with management and plan.  Kazimierz Springborn V 03/27/2013 12:17 PM

## 2013-03-28 NOTE — H&P (Signed)
Chart reviewed and agree with management and plan.  

## 2013-08-18 NOTE — L&D Delivery Note (Signed)
Delivery Note At 4:27 AM a viable female was delivered by the RN via Vaginal, Spontaneous Delivery (Presentation: Left Occiput Anterior).  APGAR: 9, 9; weight pending .   Placenta status: Intact, .  Cord: 3 vessels with the following complications: None.    Anesthesia: None  Episiotomy: None Lacerations: None Suture Repair: n/a Est. Blood Loss (mL): 350cc  Mom to postpartum.  Baby to Couplet care / Skin to Skin.  Logan Bores 04/13/2014, 5:16 AM

## 2013-09-24 ENCOUNTER — Inpatient Hospital Stay (HOSPITAL_COMMUNITY)
Admission: AD | Admit: 2013-09-24 | Discharge: 2013-09-24 | Disposition: A | Discharge status: Home / Self Care | Payer: Medicaid Other | Source: Ambulatory Visit | Attending: Obstetrics & Gynecology | Admitting: Obstetrics & Gynecology

## 2013-09-24 ENCOUNTER — Encounter (HOSPITAL_COMMUNITY): Payer: Self-pay | Admitting: *Deleted

## 2013-09-24 DIAGNOSIS — O26892 Other specified pregnancy related conditions, second trimester: Secondary | ICD-10-CM

## 2013-09-24 DIAGNOSIS — R102 Pelvic and perineal pain: Secondary | ICD-10-CM

## 2013-09-24 DIAGNOSIS — R109 Unspecified abdominal pain: Secondary | ICD-10-CM | POA: Insufficient documentation

## 2013-09-24 DIAGNOSIS — O9989 Other specified diseases and conditions complicating pregnancy, childbirth and the puerperium: Secondary | ICD-10-CM

## 2013-09-24 DIAGNOSIS — O210 Mild hyperemesis gravidarum: Secondary | ICD-10-CM | POA: Insufficient documentation

## 2013-09-24 DIAGNOSIS — N949 Unspecified condition associated with female genital organs and menstrual cycle: Secondary | ICD-10-CM | POA: Insufficient documentation

## 2013-09-24 LAB — CBC
HCT: 33.9 % — ABNORMAL LOW (ref 36.0–46.0)
HEMOGLOBIN: 11.9 g/dL — AB (ref 12.0–15.0)
MCH: 31.6 pg (ref 26.0–34.0)
MCHC: 35.1 g/dL (ref 30.0–36.0)
MCV: 89.9 fL (ref 78.0–100.0)
Platelets: 184 10*3/uL (ref 150–400)
RBC: 3.77 MIL/uL — AB (ref 3.87–5.11)
RDW: 12.8 % (ref 11.5–15.5)
WBC: 5.6 10*3/uL (ref 4.0–10.5)

## 2013-09-24 LAB — URINALYSIS, ROUTINE W REFLEX MICROSCOPIC
Bilirubin Urine: NEGATIVE
Glucose, UA: NEGATIVE mg/dL
Hgb urine dipstick: NEGATIVE
KETONES UR: NEGATIVE mg/dL
LEUKOCYTES UA: NEGATIVE
NITRITE: NEGATIVE
PROTEIN: NEGATIVE mg/dL
Specific Gravity, Urine: 1.025 (ref 1.005–1.030)
UROBILINOGEN UA: 1 mg/dL (ref 0.0–1.0)
pH: 6 (ref 5.0–8.0)

## 2013-09-24 LAB — POCT PREGNANCY, URINE: Preg Test, Ur: POSITIVE — AB

## 2013-09-24 LAB — WET PREP, GENITAL
Clue Cells Wet Prep HPF POC: NONE SEEN
Trich, Wet Prep: NONE SEEN
Yeast Wet Prep HPF POC: NONE SEEN

## 2013-09-24 MED ORDER — PROMETHAZINE HCL 25 MG PO TABS: 25.0000 mg | ORAL_TABLET | Freq: Four times a day (QID) | ORAL | Status: DC | PRN

## 2013-09-24 MED ORDER — ACETAMINOPHEN-CODEINE #3 300-30 MG PO TABS: 1.0000 | ORAL_TABLET | Freq: Four times a day (QID) | ORAL | Status: DC | PRN

## 2013-09-24 NOTE — MAU Provider Note (Signed)
History     CSN: [id number]  Arrival date and time: 09/24/13 1334   First Provider Initiated Contact with Patient 09/24/13 1537      Chief Complaint  Patient presents with  . Abdominal Pain   HPI Comments: Dominique Clements 33 y.o. [id number] presents to MAU with intermittent right lower quad pains X 7 days. The pain can be '8' on 1-10 scale. She has no pain now. She delivered in August and is no longer breast feeding. LMP is Nov, 2014 making her 14 weeks and 0 days pregnant. She just found out a few days ago that she was pregnant again. +FHT at 160. C/O nausea at times   Abdominal Pain Associated symptoms include nausea and vomiting.       Past Medical History  Diagnosis Date  . Fibroid     uterine    Past Surgical History  Procedure Laterality Date  . No past surgeries      History reviewed. No pertinent family history.  History  Substance Use Topics  . Smoking status: Never Smoker   . Smokeless tobacco: Never Used  . Alcohol Use: No    Allergies: No Known Allergies  Prescriptions prior to admission  Medication Sig Dispense Refill  . PRESCRIPTION MEDICATION Take 1 tablet by mouth daily. Pt states she was taking Birth Control daily until 09/22/13. Unknown drug/strength.        Review of Systems  Constitutional: Negative.   HENT: Negative.   Eyes: Negative.   Respiratory: Negative.   Cardiovascular: Negative.   Gastrointestinal: Positive for nausea, vomiting and abdominal pain.  Genitourinary: Negative.   Musculoskeletal: Negative.   Skin: Negative.   Neurological: Negative.   Psychiatric/Behavioral: Negative.    Physical Exam   Blood pressure 133/76, pulse 97, temperature 98.2 F (36.8 C), resp. rate 18, height 5\' 8"  (1.727 m), weight 78.744 kg (173 lb 9.6 oz), last menstrual period 06/18/2013.  Physical Exam  Constitutional: She is oriented to person, place, and time. She appears well-developed and well-nourished. No distress.  HENT:  Head:  Normocephalic and atraumatic.  Eyes: Pupils are equal, round, and reactive to light.  Neck: Normal range of motion.  Cardiovascular: Normal rate, regular rhythm and normal heart sounds.   Respiratory: Effort normal and breath sounds normal.  GI: Soft. Bowel sounds are normal. She exhibits no distension. There is no tenderness. There is no rebound and no guarding.  Genitourinary:  Genital:external negative Vaginal:small amount discharge Cervix:closed and long Bimanual:nontender uterus and andexal   Musculoskeletal: Normal range of motion.  Neurological: She is alert and oriented to person, place, and time.  Skin: Skin is warm.  Psychiatric: She has a normal mood and affect. Her behavior is normal. Judgment and thought content normal.   Results for orders placed during the hospital encounter of 09/24/13 (from the past 24 hour(s))  URINALYSIS, ROUTINE W REFLEX MICROSCOPIC     Status: None   Collection Time    09/24/13  2:55 PM      Result Value Range   Color, Urine YELLOW  YELLOW   APPearance CLEAR  CLEAR   Specific Gravity, Urine 1.025  1.005 - 1.030   pH 6.0  5.0 - 8.0   Glucose, UA NEGATIVE  NEGATIVE mg/dL   Hgb urine dipstick NEGATIVE  NEGATIVE   Bilirubin Urine NEGATIVE  NEGATIVE   Ketones, ur NEGATIVE  NEGATIVE mg/dL   Protein, ur NEGATIVE  NEGATIVE mg/dL   Urobilinogen, UA 1.0  0.0 - 1.0 mg/dL  Nitrite NEGATIVE  NEGATIVE   Leukocytes, UA NEGATIVE  NEGATIVE  POCT PREGNANCY, URINE     Status: Abnormal   Collection Time    09/24/13  2:59 PM      Result Value Range   Preg Test, Ur POSITIVE (*) NEGATIVE  WET PREP, GENITAL     Status: Abnormal   Collection Time    09/24/13  4:02 PM      Result Value Range   Yeast Wet Prep HPF POC NONE SEEN  NONE SEEN   Trich, Wet Prep NONE SEEN  NONE SEEN   Clue Cells Wet Prep HPF POC NONE SEEN  NONE SEEN   WBC, Wet Prep HPF POC FEW (*) NONE SEEN  CBC     Status: Abnormal   Collection Time    09/24/13  4:05 PM      Result Value Range    WBC 5.6  4.0 - 10.5 K/uL   RBC 3.77 (*) 3.87 - 5.11 MIL/uL   Hemoglobin 11.9 (*) 12.0 - 15.0 g/dL   HCT 33.9 (*) 36.0 - 46.0 %   MCV 89.9  78.0 - 100.0 fL   MCH 31.6  26.0 - 34.0 pg   MCHC 35.1  30.0 - 36.0 g/dL   RDW 12.8  11.5 - 15.5 %   Platelets 184  150 - 400 K/uL     MAU Course  Procedures  MDM  CBC, UA, Wet Prep, GC, Chlamydia Pt declines any pain/ nausea meds now but will take home  Assessment and Plan   A: Pelvic pains in second trimester Nausea in pregnancy  P: Phenergan 25 mg po q 6 hours prn nausea Tylenol # 3 po q4 hours prn pains Drink plenty of fluids Establish care at [place], [place] 09/24/2013, 4:05 PM

## 2013-09-24 NOTE — MAU Note (Signed)
Pt presents with complaints of pain in her lower abdomen and had a + pregnancy test on Thursday. Denies any bleeding or discharge

## 2013-09-24 NOTE — Discharge Instructions (Signed)
Abdominal Pain During Pregnancy Abdominal pain is common in pregnancy. Most of the time, it does not cause harm. There are many causes of abdominal pain. Some causes are more serious than others. Some of the causes of abdominal pain in pregnancy are easily diagnosed. Occasionally, the diagnosis takes time to understand. Other times, the cause is not determined. Abdominal pain can be a sign that something is very wrong with the pregnancy, or the pain may have nothing to do with the pregnancy at all. For this reason, always tell your health care provider if you have any abdominal discomfort. HOME CARE INSTRUCTIONS  Monitor your abdominal pain for any changes. The following actions may help to alleviate any discomfort you are experiencing:  Do not have sexual intercourse or put anything in your vagina until your symptoms go away completely.  Get plenty of rest until your pain improves.  Drink clear fluids if you feel nauseous. Avoid solid food as long as you are uncomfortable or nauseous.  Only take over-the-counter or prescription medicine as directed by your health care provider.  Keep all follow-up appointments with your health care provider. SEEK IMMEDIATE MEDICAL CARE IF:  You are bleeding, leaking fluid, or passing tissue from the vagina.  You have increasing pain or cramping.  You have persistent vomiting.  You have painful or bloody urination.  You have a fever.  You notice a decrease in your baby's movements.  You have extreme weakness or feel faint.  You have shortness of breath, with or without abdominal pain.  You develop a severe headache with abdominal pain.  You have abnormal vaginal discharge with abdominal pain.  You have persistent diarrhea.  You have abdominal pain that continues even after rest, or gets worse. MAKE SURE YOU:   Understand these instructions.  Will watch your condition.  Will get help right away if you are not doing well or get  worse. Document Released: 08/04/2005 Document Revised: 05/25/2013 Document Reviewed: 03/03/2013 [facility] Patient Information 2014 [place], [place]. Morning Sickness Morning sickness is when you feel sick to your stomach (nauseous) during pregnancy. This nauseous feeling may or may not come with vomiting. It often occurs in the morning but can be a problem any time of day. Morning sickness is most common during the first trimester, but it may continue throughout pregnancy. While morning sickness is unpleasant, it is usually harmless unless you develop severe and continual vomiting (hyperemesis gravidarum). This condition requires more intense treatment.  CAUSES  The cause of morning sickness is not completely known but seems to be related to normal hormonal changes that occur in pregnancy. RISK FACTORS You are at greater risk if you:  Experienced nausea or vomiting before your pregnancy.  Had morning sickness during a previous pregnancy.  Are pregnant with more than one baby, such as twins. TREATMENT  Do not use any medicines (prescription, over-the-counter, or herbal) for morning sickness without first talking to your health care provider. Your health care provider may prescribe or recommend:  Vitamin B6 supplements.  Anti-nausea medicines.  The herbal medicine ginger. HOME CARE INSTRUCTIONS   Only take over-the-counter or prescription medicines as directed by your health care provider.  Taking multivitamins before getting pregnant can prevent or decrease the severity of morning sickness in most women.   Eat a piece of dry toast or unsalted crackers before getting out of bed in the morning.   Eat five or six small meals a day.   Eat dry and bland foods (rice, baked potato).  Foods high in carbohydrates are often helpful.  Do not drink liquids with your meals. Drink liquids between meals.   Avoid greasy, fatty, and spicy foods.   Get someone to cook for you if the smell of  any food causes nausea and vomiting.   If you feel nauseous after taking prenatal vitamins, take the vitamins at night or with a snack.  Snack on protein foods (nuts, yogurt, cheese) between meals if you are hungry.   Eat unsweetened gelatins for desserts.   Wearing an acupressure wristband (worn for sea sickness) may be helpful.   Acupuncture may be helpful.   Do not smoke.   Get a humidifier to keep the air in your house free of odors.   Get plenty of fresh air. SEEK MEDICAL CARE IF:   Your home remedies are not working, and you need medicine.  You feel dizzy or lightheaded.  You are losing weight. SEEK IMMEDIATE MEDICAL CARE IF:   You have persistent and uncontrolled nausea and vomiting.  You pass out (faint). Document Released: 09/25/2006 Document Revised: 04/06/2013 Document Reviewed: 01/19/2013 [facility] Patient Information 2014 [place].

## 2013-09-27 LAB — GC/CHLAMYDIA PROBE AMP
CT Probe RNA: NEGATIVE
GC PROBE AMP APTIMA: NEGATIVE

## 2013-12-12 LAB — OB RESULTS CONSOLE HEPATITIS B SURFACE ANTIGEN: Hepatitis B Surface Ag: NEGATIVE

## 2013-12-12 LAB — OB RESULTS CONSOLE HIV ANTIBODY (ROUTINE TESTING): HIV: NONREACTIVE

## 2013-12-12 LAB — OB RESULTS CONSOLE ABO/RH: RH Type: POSITIVE

## 2013-12-12 LAB — OB RESULTS CONSOLE ANTIBODY SCREEN: ANTIBODY SCREEN: NEGATIVE

## 2013-12-12 LAB — OB RESULTS CONSOLE GC/CHLAMYDIA
Chlamydia: NEGATIVE
GC PROBE AMP, GENITAL: NEGATIVE

## 2013-12-12 LAB — OB RESULTS CONSOLE RPR: RPR: NONREACTIVE

## 2013-12-12 LAB — OB RESULTS CONSOLE RUBELLA ANTIBODY, IGM: Rubella: IMMUNE

## 2014-02-06 ENCOUNTER — Encounter (HOSPITAL_COMMUNITY): Payer: Self-pay

## 2014-03-25 LAB — OB RESULTS CONSOLE GBS: GBS: POSITIVE

## 2014-04-13 ENCOUNTER — Inpatient Hospital Stay (HOSPITAL_COMMUNITY)
Admission: AD | Admit: 2014-04-13 | Discharge: 2014-04-15 | DRG: 775 | Disposition: A | Discharge status: Home / Self Care | Payer: Medicaid Other | Source: Ambulatory Visit | Attending: Obstetrics and Gynecology | Admitting: Obstetrics and Gynecology

## 2014-04-13 ENCOUNTER — Encounter (HOSPITAL_COMMUNITY): Payer: Self-pay

## 2014-04-13 DIAGNOSIS — Z22330 Carrier of Group B streptococcus: Secondary | ICD-10-CM

## 2014-04-13 DIAGNOSIS — O99892 Other specified diseases and conditions complicating childbirth: Principal | ICD-10-CM | POA: Diagnosis present

## 2014-04-13 DIAGNOSIS — O479 False labor, unspecified: Secondary | ICD-10-CM | POA: Diagnosis present

## 2014-04-13 DIAGNOSIS — O9989 Other specified diseases and conditions complicating pregnancy, childbirth and the puerperium: Principal | ICD-10-CM

## 2014-04-13 LAB — CBC
HCT: 34.7 % — ABNORMAL LOW (ref 36.0–46.0)
Hemoglobin: 11.9 g/dL — ABNORMAL LOW (ref 12.0–15.0)
MCH: 31.8 pg (ref 26.0–34.0)
MCHC: 34.3 g/dL (ref 30.0–36.0)
MCV: 92.8 fL (ref 78.0–100.0)
PLATELETS: 142 10*3/uL — AB (ref 150–400)
RBC: 3.74 MIL/uL — AB (ref 3.87–5.11)
RDW: 14.4 % (ref 11.5–15.5)
WBC: 5.7 10*3/uL (ref 4.0–10.5)

## 2014-04-13 LAB — RPR

## 2014-04-13 MED ORDER — IBUPROFEN 600 MG PO TABS: 600.0000 mg | ORAL_TABLET | Freq: Four times a day (QID) | ORAL | Status: DC | PRN

## 2014-04-13 MED ORDER — ONDANSETRON HCL 4 MG PO TABS: 4.0000 mg | ORAL_TABLET | ORAL | Status: DC | PRN

## 2014-04-13 MED ORDER — LACTATED RINGERS IV SOLN: 500.0000 mL | INTRAVENOUS | Status: DC | PRN

## 2014-04-13 MED ORDER — PRENATAL MULTIVITAMIN CH
1.0000 | ORAL_TABLET | Freq: Every day | ORAL | Status: DC
  Administered 2014-04-13 – 2014-04-14 (×2): 1 via ORAL
  Filled 2014-04-13 (×2): qty 1

## 2014-04-13 MED ORDER — SODIUM CHLORIDE 0.9 % IV SOLN
2.0000 g | Freq: Once | INTRAVENOUS | Status: AC
  Administered 2014-04-13: 2 g via INTRAVENOUS
  Filled 2014-04-13: qty 2000

## 2014-04-13 MED ORDER — ZOLPIDEM TARTRATE 5 MG PO TABS
5.0000 mg | ORAL_TABLET | Freq: Every evening | ORAL | Status: DC | PRN
Start: 2014-04-13 — End: 2014-04-15

## 2014-04-13 MED ORDER — IBUPROFEN 600 MG PO TABS
600.0000 mg | ORAL_TABLET | Freq: Four times a day (QID) | ORAL | Status: DC
  Administered 2014-04-13 – 2014-04-15 (×8): 600 mg via ORAL
  Filled 2014-04-13 (×8): qty 1

## 2014-04-13 MED ORDER — ONDANSETRON HCL 4 MG/2ML IJ SOLN: 4.0000 mg | Freq: Four times a day (QID) | INTRAMUSCULAR | Status: DC | PRN

## 2014-04-13 MED ORDER — LIDOCAINE HCL (PF) 1 % IJ SOLN
30.0000 mL | INTRAMUSCULAR | Status: DC | PRN
  Filled 2014-04-13: qty 30

## 2014-04-13 MED ORDER — CITRIC ACID-SODIUM CITRATE 334-500 MG/5ML PO SOLN: 30.0000 mL | ORAL | Status: DC | PRN

## 2014-04-13 MED ORDER — OXYCODONE-ACETAMINOPHEN 5-325 MG PO TABS
1.0000 | ORAL_TABLET | ORAL | Status: DC | PRN
Start: 2014-04-13 — End: 2014-04-13

## 2014-04-13 MED ORDER — DIPHENHYDRAMINE HCL 25 MG PO CAPS: 25.0000 mg | ORAL_CAPSULE | Freq: Four times a day (QID) | ORAL | Status: DC | PRN

## 2014-04-13 MED ORDER — ACETAMINOPHEN 325 MG PO TABS: 650.0000 mg | ORAL_TABLET | ORAL | Status: DC | PRN

## 2014-04-13 MED ORDER — FLEET ENEMA 7-19 GM/118ML RE ENEM
1.0000 | ENEMA | RECTAL | Status: DC | PRN
Start: 2014-04-13 — End: 2014-04-13

## 2014-04-13 MED ORDER — SENNOSIDES-DOCUSATE SODIUM 8.6-50 MG PO TABS
2.0000 | ORAL_TABLET | ORAL | Status: DC
  Administered 2014-04-14 (×2): 2 via ORAL
  Filled 2014-04-13 (×2): qty 2

## 2014-04-13 MED ORDER — OXYTOCIN 40 UNITS IN LACTATED RINGERS INFUSION - SIMPLE MED
62.5000 mL/h | INTRAVENOUS | Status: DC
  Filled 2014-04-13: qty 1000

## 2014-04-13 MED ORDER — ONDANSETRON HCL 4 MG/2ML IJ SOLN: 4.0000 mg | INTRAMUSCULAR | Status: DC | PRN

## 2014-04-13 MED ORDER — WITCH HAZEL-GLYCERIN EX PADS: 1.0000 "application " | MEDICATED_PAD | CUTANEOUS | Status: DC | PRN

## 2014-04-13 MED ORDER — LANOLIN HYDROUS EX OINT: TOPICAL_OINTMENT | CUTANEOUS | Status: DC | PRN

## 2014-04-13 MED ORDER — OXYTOCIN BOLUS FROM INFUSION: 500.0000 mL | INTRAVENOUS | Status: DC

## 2014-04-13 MED ORDER — LACTATED RINGERS IV SOLN: INTRAVENOUS | Status: DC

## 2014-04-13 MED ORDER — DIBUCAINE 1 % RE OINT: 1.0000 "application " | TOPICAL_OINTMENT | RECTAL | Status: DC | PRN

## 2014-04-13 MED ORDER — SIMETHICONE 80 MG PO CHEW
80.0000 mg | CHEWABLE_TABLET | ORAL | Status: DC | PRN
Start: 2014-04-13 — End: 2014-04-15

## 2014-04-13 MED ORDER — BENZOCAINE-MENTHOL 20-0.5 % EX AERO: 1.0000 "application " | INHALATION_SPRAY | CUTANEOUS | Status: DC | PRN

## 2014-04-13 MED ORDER — OXYCODONE-ACETAMINOPHEN 5-325 MG PO TABS: 1.0000 | ORAL_TABLET | ORAL | Status: DC | PRN

## 2014-04-13 MED ORDER — TETANUS-DIPHTH-ACELL PERTUSSIS 5-2.5-18.5 LF-MCG/0.5 IM SUSP: 0.5000 mL | Freq: Once | INTRAMUSCULAR | Status: DC

## 2014-04-13 NOTE — Progress Notes (Signed)
UR chart review completed.  

## 2014-04-13 NOTE — MAU Note (Signed)
contractions 

## 2014-04-13 NOTE — H&P (Addendum)
IZZIE Clements is a 33 y.o. female [phone] at 39+ weeks ( EDD 04/17/14 by LMP c/w 19 week [place]  )presented in active labor and had a precipitous vaginal delivery with the RN.  THe patient arrived in MAU and at 403 AM was 5 cm dilated, I was called and came immediately to the hospital and arrived just after the patient delivered within 10 min of arriving to L&D with the RN attending.  Delivery time was 427AM and I arrived and delivered the placenta at 438AM.  Prenatal care uncomplicated except +GBS and late to care at 20 weeks.  Pt plans an Essure postpartum and has signed papers.  Maternal Medical History:  Reason for admission: Contractions.   Contractions: Onset was 1-2 hours ago.   Frequency: regular.   Perceived severity is strong.    Fetal activity: Perceived fetal activity is normal.    Prenatal Complications - Diabetes: none.    OB History   Grav Para Term Preterm Abortions TAB SAB Ect Mult Living   5 3 3  0 1 0 1 0 0 3    NVSD x 3   7 1/2lb-8+lbs SAB x1  Past Medical History  Diagnosis Date  . Fibroid     uterine   Past Surgical History  Procedure Laterality Date  . No past surgeries     Family History: family history is not on file. Social History:  reports that she has never smoked. She has never used smokeless tobacco. She reports that she does not drink alcohol or use illicit drugs.   Prenatal Transfer Tool  Maternal Diabetes: No Genetic Screening:  Too late to care Maternal Ultrasounds/Referrals: Normal Fetal Ultrasounds or other Referrals:  None Maternal Substance Abuse:  No Significant Maternal Medications:  None Significant Maternal Lab Results:  Lab values include: Group B Strep positive Other Comments:  None  ROS  Dilation: 7 Effacement (%): 90 Station: 0 Exam by:: Chief Operating Officer  Blood pressure 156/121, pulse 88, temperature 98.3 F (36.8 C), temperature source Oral, resp. rate 18, height 5\' 8"  (1.727 m), weight 78.926 kg (174 lb), last menstrual  period 06/18/2013, SpO2 81.00%. Maternal Exam:  Uterine Assessment: Contraction strength is firm.  Contraction frequency is regular.   Abdomen: Patient reports no abdominal tenderness. Fetal presentation: vertex  Introitus: Normal vulva. Normal vagina.    Physical Exam  Constitutional: She appears well-developed and well-nourished.  Cardiovascular: Normal rate and regular rhythm.   Respiratory: Effort normal.  GI: Soft.  Genitourinary: Vagina normal.  Neurological: She is alert.  Psychiatric: Her behavior is normal.    Prenatal labs: ABO, Rh: O/Positive/-- (04/27 0000) Antibody: Negative (04/27 0000) Rubella: Immune (04/27 0000) RPR: Nonreactive (04/27 0000)  HBsAg: Negative (04/27 0000)  HIV: Non-reactive (04/27 0000)  GBS: Positive (08/08 0000)  One hour GTT 137 Three hour GTT 80/123/97/79  Assessment/Plan: Pt s/p precipitous NSVD by the RN.  See delivery note for details. Ampicillin hanging when delivered, but not all the way in.  Plans Essure in office pp.  Logan Bores 04/13/2014, 5:03 AM

## 2014-04-13 NOTE — Lactation Note (Signed)
This note was copied from the chart of Dominique Clements. Lactation Consultation Note  Patient Name: Dominique Clements Date: 04/13/2014 Reason for consult: Other (Comment) (clarification of mom's choice)  Mom had stated she planned to formula feed on admission, then after baby was born she stated she wanted to "try" breastfeeding.  [facility] visited and offered mom breastfeeding assistance but she states she has decided to formula/bottle feed and no longer will breastfeed this baby.   Maternal Data    Feeding Feeding Type: Bottle Fed - Formula  LATCH Score/Interventions              initial LATCH scores=10 but now bottle-feeding        Lactation Tools Discussed/Used   N/A  Consult Status Consult Status: Complete    Bernita Buffy 04/13/2014, 9:40 PM

## 2014-04-14 LAB — CBC
HEMATOCRIT: 27.6 % — AB (ref 36.0–46.0)
Hemoglobin: 9.4 g/dL — ABNORMAL LOW (ref 12.0–15.0)
MCH: 31.8 pg (ref 26.0–34.0)
MCHC: 34.1 g/dL (ref 30.0–36.0)
MCV: 93.2 fL (ref 78.0–100.0)
Platelets: 139 10*3/uL — ABNORMAL LOW (ref 150–400)
RBC: 2.96 MIL/uL — ABNORMAL LOW (ref 3.87–5.11)
RDW: 14.6 % (ref 11.5–15.5)
WBC: 7.1 10*3/uL (ref 4.0–10.5)

## 2014-04-14 NOTE — Progress Notes (Signed)
Post Partum Day 1 Subjective: no complaints and tolerating PO  Objective: Blood pressure 114/63, pulse 79, temperature 98.6 F (37 C), temperature source Oral, resp. rate 18, height 5\' 8"  (1.727 m), weight 78.926 kg (174 lb), last menstrual period 06/18/2013, SpO2 81.00%, unknown if currently breastfeeding.  Physical Exam:  General: alert and cooperative Lochia: appropriate Uterine Fundus: firm  Recent Labs  04/13/14 0401 04/14/14 0555  HGB 11.9* 9.4*  HCT 34.7* 27.6*    Assessment/Plan: Plan for discharge tomorrow Plans Essure pp and will get scheduled.   LOS: 1 day   Jasime Westergren W 04/14/2014, 9:00 AM

## 2014-04-15 MED ORDER — OXYCODONE-ACETAMINOPHEN 5-325 MG PO TABS: 1.0000 | ORAL_TABLET | Freq: Four times a day (QID) | ORAL | Status: DC | PRN

## 2014-04-15 MED ORDER — IBUPROFEN 800 MG PO TABS: 800.0000 mg | ORAL_TABLET | Freq: Three times a day (TID) | ORAL | Status: DC | PRN

## 2014-04-15 MED ORDER — PRENATAL MULTIVITAMIN CH: 1.0000 | ORAL_TABLET | Freq: Every day | ORAL | Status: DC

## 2014-04-15 NOTE — Progress Notes (Signed)
Post Partum Day 2 Subjective: no complaints, up ad lib, tolerating PO and nl lochia, pain controlled  Objective: Blood pressure 117/63, pulse 64, temperature 98.6 F (37 C), temperature source Oral, resp. rate 18, height 5\' 8"  (1.727 m), weight 78.926 kg (174 lb), last menstrual period 06/18/2013, SpO2 81.00%, unknown if currently breastfeeding.  Physical Exam:  General: alert and no distress Lochia: appropriate Uterine Fundus: firm   Recent Labs  04/13/14 0401 04/14/14 0555  HGB 11.9* 9.4*  HCT 34.7* 27.6*    Assessment/Plan: Discharge home.  Routine care.  D/C with Motrin, percocet and PNV.  F/U 6 weeks   LOS: 2 days   Bovard-Stuckert, Santana Edell 04/15/2014, 8:17 AM

## 2014-04-15 NOTE — Discharge Summary (Signed)
Obstetric Discharge Summary Reason for Admission: onset of labor Prenatal Procedures: none Intrapartum Procedures: spontaneous vaginal delivery Postpartum Procedures: none Complications-Operative and Postpartum: none Hemoglobin  Date Value Ref Range Status  04/14/2014 9.4* 12.0 - 15.0 g/dL Final     DELTA CHECK NOTED     REPEATED TO VERIFY     HCT  Date Value Ref Range Status  04/14/2014 27.6* 36.0 - 46.0 % Final    Physical Exam:  General: alert and no distress Lochia: appropriate Uterine Fundus: firm   Discharge Diagnoses: Term Pregnancy-delivered  Discharge Information: Date: 04/15/2014 Activity: pelvic rest Diet: routine Medications: PNV, Ibuprofen and Percocet Condition: stable Instructions: refer to practice specific booklet Discharge to: home Follow-up Information   Follow up with Logan Bores, MD In 6 weeks. (My nurse will call to scedule your postpartum exam.  You will need to pick up Depo Provera that day to bring in with you for injection from the pharmacy.  We will do the Essure procedure 2-3 weeks after that.)    Specialty:  Obstetrics and Gynecology   Contact information:   [address] [place] [address] [phone]       Follow up with Logan Bores, MD On 05/25/2014. (at 10:30am.)    Specialty:  Obstetrics and Gynecology   Contact information:   [address] [place] [address] [phone]       Follow up with Logan Bores, MD. Schedule an appointment as soon as possible for a visit in 6 weeks. (Postpartum check)    Specialty:  Obstetrics and Gynecology   Contact information:   [address] [place] [address] [phone]       Newborn Data: Live born female  Birth Weight: 7 lb 12 oz (3515 g) APGAR: 9, 9  Home with mother.  Bovard-Stuckert, Halton Neas 04/15/2014, 8:28 AM

## 2014-04-18 ENCOUNTER — Encounter (HOSPITAL_COMMUNITY): Payer: Self-pay | Admitting: *Deleted

## 2014-06-19 ENCOUNTER — Encounter (HOSPITAL_COMMUNITY): Payer: Self-pay | Admitting: *Deleted

## 2019-06-08 ENCOUNTER — Ambulatory Visit: Payer: Self-pay

## 2019-06-15 ENCOUNTER — Encounter: Payer: Self-pay | Admitting: *Deleted

## 2019-07-06 ENCOUNTER — Telehealth: Payer: Self-pay | Admitting: Obstetrics and Gynecology

## 2019-07-06 NOTE — Telephone Encounter (Signed)
Spoke to patient about her appointment on 11/19 @ 9:30. Patient instructed that this visit will be a phone visit and she does not have to come to the office for this appointment. Patient instructed a nurse will be call her around her appointment time. Patient instructed to be available around her appointment. Patient verbalized understanding.

## 2019-07-07 ENCOUNTER — Other Ambulatory Visit: Payer: Self-pay

## 2019-07-07 ENCOUNTER — Ambulatory Visit (INDEPENDENT_AMBULATORY_CARE_PROVIDER_SITE_OTHER): Payer: Self-pay | Admitting: *Deleted

## 2019-07-07 DIAGNOSIS — D219 Benign neoplasm of connective and other soft tissue, unspecified: Secondary | ICD-10-CM | POA: Insufficient documentation

## 2019-07-07 DIAGNOSIS — O0990 Supervision of high risk pregnancy, unspecified, unspecified trimester: Secondary | ICD-10-CM

## 2019-07-07 DIAGNOSIS — O09529 Supervision of elderly multigravida, unspecified trimester: Secondary | ICD-10-CM

## 2019-07-07 HISTORY — DX: Supervision of high risk pregnancy, unspecified, unspecified trimester: O09.90

## 2019-07-07 NOTE — Patient Instructions (Signed)
Explained I am completing her New OB Intake today. We discussed Her EDD and that it is based on  sure LMP . I reviewed her allergies, meds, OB History, Medical /Surgical history, and appropriate screenings. I explained I will send her the [employer] app- app sent to her while on phone.  I explained we will send a blood pressure cuff to [facility] that will fill that prescription and they  will call her to verify her information. I asked her to bring the blood pressure cuff with her to her first ob appointment so we can show her how to use it. Explained  then we will have her take her blood pressure weekly and enter into the app. Explained she will have some visits in office and some virtually. She already has Community education officer. Reviewed appointment date/ time with her , our location and to wear mask, no visitors. Explained she will have exam, ob bloodwork, hemoglobin a1C, cbg , genetic testing if desired, pap if needed. I scheduled an [place] at 19 weeks and gave her the appointment. She voices understanding.

## 2019-07-07 NOTE — Progress Notes (Signed)
I connected with  Arie Sabina on 07/07/19 at  9:30 AM EST by telephone and verified that I am speaking with the correct person using two identifiers.   I discussed the limitations, risks, security and privacy concerns of performing an evaluation and management service by telephone and the availability of in person appointments. I also discussed with the patient that there may be a patient responsible charge related to this service. The patient expressed understanding and agreed to proceed. Explained I am completing her New OB Intake today. We discussed Her EDD and that it is based on  sure LMP . I reviewed her allergies, meds, OB History, Medical /Surgical history, and appropriate screenings. I explained I will send her the Babyscripts app- app sent to her while on phone.  I explained we will send a blood pressure cuff to [facility] once she has active medicaid. Explained  then we will have her take her blood pressure weekly and enter into the app. Explained she will have some visits in office and some virtually. She already has Community education officer. Reviewed appointment date/ time with her , our location and to wear mask, no visitors. Explained she will have exam, ob bloodwork, hemoglobin a1C, cbg , genetic testing if desired, pap if needed.she reports she had a pap with [facility] 2019.  I scheduled an [place] at 19 weeks and gave her the appointment. Due to her age I offered genetic counseling which she declined. She voices understanding.  Linda,RN 07/07/2019  9:47 AM

## 2019-07-21 ENCOUNTER — Encounter: Payer: Self-pay | Admitting: Obstetrics and Gynecology

## 2019-07-21 ENCOUNTER — Other Ambulatory Visit: Payer: Self-pay

## 2019-07-21 ENCOUNTER — Ambulatory Visit (INDEPENDENT_AMBULATORY_CARE_PROVIDER_SITE_OTHER): Payer: BC Managed Care – PPO | Admitting: Obstetrics and Gynecology

## 2019-07-21 VITALS — BP 124/71 | HR 91 | Wt 184.0 lb

## 2019-07-21 DIAGNOSIS — R7309 Other abnormal glucose: Secondary | ICD-10-CM | POA: Diagnosis not present

## 2019-07-21 DIAGNOSIS — O0991 Supervision of high risk pregnancy, unspecified, first trimester: Secondary | ICD-10-CM

## 2019-07-21 DIAGNOSIS — O0990 Supervision of high risk pregnancy, unspecified, unspecified trimester: Secondary | ICD-10-CM

## 2019-07-21 DIAGNOSIS — O09521 Supervision of elderly multigravida, first trimester: Secondary | ICD-10-CM | POA: Diagnosis not present

## 2019-07-21 DIAGNOSIS — Z3A12 12 weeks gestation of pregnancy: Secondary | ICD-10-CM | POA: Diagnosis not present

## 2019-07-21 DIAGNOSIS — O09529 Supervision of elderly multigravida, unspecified trimester: Secondary | ICD-10-CM

## 2019-07-21 MED ORDER — ASPIRIN EC 81 MG PO TBEC: 81.0000 mg | DELAYED_RELEASE_TABLET | Freq: Every day | ORAL | 1 refills | Status: DC

## 2019-07-21 NOTE — Progress Notes (Signed)
History:   Dominique Clements is a 38 y.o. [id number] at [redacted]w[redacted]d by LMP being seen today for her first obstetrical visit.  Her obstetrical history is significant for advanced maternal age and fibroids. Patient does intend to breast feed. Pregnancy history fully reviewed.  Patient reports no complaints.      HISTORY: OB History  Gravida Para Term Preterm AB Living  8 4 4  0 3 4  SAB TAB Ectopic Multiple Live Births  3 0 0 0 4    # Outcome Date GA Lbr Len/2nd Weight Sex Delivery Anes PTL Lv  8 Current           7 SAB 2019          6 SAB 2016          5 Term 04/13/14 [redacted]w[redacted]d 02:56 / 00:01 7 lb 12 oz (3.515 kg) F Vag-Spont None  LIV     Birth Comments: Maternal +GBS     Name: Dominique Clements     Apgar1: 9  Apgar5: 9  4 Term 03/24/13 [redacted]w[redacted]d 05:36 / 00:02 7 lb 9.7 oz (3.45 kg) M Vag-Spont None  LIV     Birth Comments: Boy [name] is a 7 lb 9.7 oz (3450 g) female infant born at Gestational Age: [redacted]w[redacted]d   Prenatal & Delivery Information Mother, Dominique Clements , is a 66 y.o.  [phone] . Prenatal labs ABO, Rh --/--/O POS, O POS (08/07 2200)     Antibody NEG (08/07 2200)   Rubella Immune (01/15 0000)   RPR NON REACTIVE (08/07 2200)   HBsAg Negative (01/15 0000)   HIV Non-reactive (01/15 0000)   GBS Negative (07/17 0000)       Prenatal care: good. Pregnancy complications: fibroid Delivery complications: . none Date & time of delivery: 03/24/2013, 10:08 PM Route of delivery: Vaginal, Spontaneous Delivery. Apgar scores: 9 at 1 minute, 9 at 5 minutes. ROM: 03/24/2013, 9:30 Pm, ;Spontaneous, Clear.  30 minutes prior to delivery Maternal antibiotics: none    breastfed x 12 (latch 9), 5 voids, no stool since shortly after birth but was large volume.    Immunization History   Administered  Date(s) Administered   .  Hepatitis B, ped/adol  03/25/2013      Screening Tests, Labs & Immunizations: Infant Blood Type: O POS (08/07 2300) HepB vaccine: 03/25/13 Newborn screen: DRAWN BY RN   (08/09 0025) Hearing Screen Right Ear: Pass (08/08 1701)           Left Ear: Pass (08/08 1701) Transcutaneous bilirubin: 10.0 /34 hours (08/09 0831), risk zone 75-95. Risk factors for jaundice: older sib required phototherapy Bilirubin:    Recent Labs Lab  03/26/13 0004  03/26/13 0831  03/26/13 1051   TCB  7.1  10.0   --    BILITOT   --    --   6.4   BILIDIR   --    --   0.3    serum bilirubin low risk at 37 hours Congenital Heart Screening:     Age at Inititial Screening: 25 hours Initial Screening Pulse 02 saturation of RIGHT hand: 98 % Pulse 02 saturation of Foot: 95 % Difference (right hand - foot): 3 % Pass / Fail: Pass       Name: Dominique, Clements     Apgar1: 9  Apgar5: 9  3 Term 04/15/03   8 lb 3 oz (3.714 kg) M Vag-Spont None  LIV     Birth Comments: no complications  2 SAB 2002 [redacted]w[redacted]d         1 Term 11/11/97   7 lb 15 oz (3.6 kg) M Vag-Spont None  LIV     Birth Comments: no complications    Last pap smear was done 2019 and was normal  Past Medical History:  Diagnosis Date  . Fibroid    uterine   Past Surgical History:  Procedure Laterality Date  . NO PAST SURGERIES     Family History  Problem Relation Age of Onset  . Cardiomyopathy Mother    Social History   Tobacco Use  . Smoking status: Never Smoker  . Smokeless tobacco: Never Used  Substance Use Topics  . Alcohol use: No  . Drug use: No   No Known Allergies Current Outpatient Medications on File Prior to Visit  Medication Sig Dispense Refill  . Prenatal Vit-Fe Fumarate-FA (PRENATAL MULTIVITAMIN) TABS tablet Take 1 tablet by mouth daily at 12 noon. 30 tablet 3  . ibuprofen (ADVIL,MOTRIN) 800 MG tablet Take 1 tablet (800 mg total) by mouth every 8 (eight) hours as needed. (Patient not taking: Reported on 07/21/2019) 40 tablet 1  . oxyCODONE-acetaminophen (PERCOCET/ROXICET) 5-325 MG per tablet Take 1-2 tablets by mouth every 6 (six) hours as needed for severe pain. (Patient not taking: Reported on  07/21/2019) 15 tablet 0   No current facility-administered medications on file prior to visit.     Review of Systems Pertinent items noted in HPI and remainder of comprehensive ROS otherwise negative. Physical Exam:   Vitals:   07/21/19 1019  BP: 124/71  Pulse: 91  Weight: 184 lb (83.5 kg)   Fetal Heart Rate (bpm): 154 Uterus:     Pelvic Exam: Perineum: no hemorrhoids, normal perineum   Vulva: normal external genitalia, no lesions   Vagina:  normal mucosa, normal discharge   Cervix: no lesions and normal, pap smear done.    Adnexa: normal adnexa and no mass, fullness, tenderness   Bony Pelvis: average  System: General: well-developed, well-nourished female in no acute distress   Breasts:  normal appearance, no masses or tenderness bilaterally   Skin: normal coloration and turgor, no rashes   Neurologic: oriented, normal, negative, normal mood   Extremities: normal strength, tone, and muscle mass, ROM of all joints is normal   HEENT PERRLA, extraocular movement intact and sclera clear, anicteric   Mouth/Teeth mucous membranes moist, pharynx normal without lesions and dental hygiene good   Neck supple and no masses   Cardiovascular: regular rate and rhythm   Respiratory:  no respiratory distress, normal breath sounds   Abdomen: soft, non-tender; bowel sounds normal; no masses,  no organomegaly    Assessment:    Pregnancy: [id number] Patient Active Problem List   Diagnosis Date Noted  . Elevated hemoglobin A1c 07/23/2019  . Supervision of high risk pregnancy, antepartum 07/07/2019  . AMA (advanced maternal age) multigravida 35+ 07/07/2019  . Fibroid   . Normal labor and delivery 04/13/2014  . Precipitous delivery 04/13/2014     Plan:   1. Supervision of high risk pregnancy, antepartum  - Culture, OB Urine - Genetic Screening - Obstetric Panel, Including HIV - Hemoglobin A1c  2. Antepartum multigravida of advanced maternal age  Discussed Antenatal testing at 73  weeks with growth [place] Q4 weeks. Start BASA, rx sent    Initial labs drawn. Continue prenatal vitamins. Genetic Screening discussed, NIPS: requested. Ultrasound discussed; fetal anatomic survey: ordered. Problem list reviewed and updated. The nature of [facility] with multiple MDs and other Advanced Practice Providers was explained to patient; also emphasized that residents, students are part of our team. Routine obstetric precautions reviewed. No follow-ups on file.       Product/process development scientist for [employer], [place]

## 2019-07-21 NOTE — Progress Notes (Signed)
Pap 2019 [facility]

## 2019-07-22 LAB — OBSTETRIC PANEL, INCLUDING HIV
Antibody Screen: NEGATIVE
Basophils Absolute: 0 10*3/uL (ref 0.0–0.2)
Basos: 0 %
EOS (ABSOLUTE): 0 10*3/uL (ref 0.0–0.4)
Eos: 0 %
HIV Screen 4th Generation wRfx: NONREACTIVE
Hematocrit: 33.1 % — ABNORMAL LOW (ref 34.0–46.6)
Hemoglobin: 11.4 g/dL (ref 11.1–15.9)
Hepatitis B Surface Ag: NEGATIVE
Immature Grans (Abs): 0 10*3/uL (ref 0.0–0.1)
Immature Granulocytes: 0 %
Lymphocytes Absolute: 1 10*3/uL (ref 0.7–3.1)
Lymphs: 27 %
MCH: 32.2 pg (ref 26.6–33.0)
MCHC: 34.4 g/dL (ref 31.5–35.7)
MCV: 94 fL (ref 79–97)
Monocytes Absolute: 0.4 10*3/uL (ref 0.1–0.9)
Monocytes: 9 %
Neutrophils Absolute: 2.5 10*3/uL (ref 1.4–7.0)
Neutrophils: 64 %
Platelets: 216 10*3/uL (ref 150–450)
RBC: 3.54 x10E6/uL — ABNORMAL LOW (ref 3.77–5.28)
RDW: 13.3 % (ref 11.7–15.4)
RPR Ser Ql: NONREACTIVE
Rh Factor: POSITIVE
Rubella Antibodies, IGG: 1.33 index (ref >0.99)
WBC: 3.9 10*3/uL (ref 3.4–10.8)

## 2019-07-22 LAB — HEMOGLOBIN A1C
Est. average glucose Bld gHb Est-mCnc: 117 mg/dL
Hgb A1c MFr Bld: 5.7 % — ABNORMAL HIGH (ref 4.8–5.6)

## 2019-07-23 DIAGNOSIS — R7309 Other abnormal glucose: Secondary | ICD-10-CM | POA: Insufficient documentation

## 2019-07-23 LAB — URINE CULTURE, OB REFLEX: Organism ID, Bacteria: NO GROWTH

## 2019-07-23 LAB — CULTURE, OB URINE

## 2019-07-25 ENCOUNTER — Inpatient Hospital Stay (HOSPITAL_COMMUNITY): Payer: BC Managed Care – PPO

## 2019-07-25 ENCOUNTER — Encounter (HOSPITAL_COMMUNITY): Payer: Self-pay

## 2019-07-25 ENCOUNTER — Inpatient Hospital Stay (HOSPITAL_COMMUNITY)
Admission: AD | Admit: 2019-07-25 | Discharge: 2019-07-25 | Disposition: A | Discharge status: Home / Self Care | Payer: BC Managed Care – PPO | Attending: Obstetrics and Gynecology | Admitting: Obstetrics and Gynecology

## 2019-07-25 ENCOUNTER — Other Ambulatory Visit: Payer: Self-pay

## 2019-07-25 DIAGNOSIS — O09529 Supervision of elderly multigravida, unspecified trimester: Secondary | ICD-10-CM

## 2019-07-25 DIAGNOSIS — O0990 Supervision of high risk pregnancy, unspecified, unspecified trimester: Secondary | ICD-10-CM

## 2019-07-25 DIAGNOSIS — O09522 Supervision of elderly multigravida, second trimester: Secondary | ICD-10-CM

## 2019-07-25 DIAGNOSIS — O0992 Supervision of high risk pregnancy, unspecified, second trimester: Secondary | ICD-10-CM

## 2019-07-25 DIAGNOSIS — Z3A12 12 weeks gestation of pregnancy: Secondary | ICD-10-CM | POA: Insufficient documentation

## 2019-07-25 DIAGNOSIS — D251 Intramural leiomyoma of uterus: Secondary | ICD-10-CM | POA: Insufficient documentation

## 2019-07-25 DIAGNOSIS — Z7982 Long term (current) use of aspirin: Secondary | ICD-10-CM | POA: Diagnosis not present

## 2019-07-25 DIAGNOSIS — N939 Abnormal uterine and vaginal bleeding, unspecified: Secondary | ICD-10-CM

## 2019-07-25 DIAGNOSIS — O3411 Maternal care for benign tumor of corpus uteri, first trimester: Secondary | ICD-10-CM | POA: Diagnosis not present

## 2019-07-25 DIAGNOSIS — R7309 Other abnormal glucose: Secondary | ICD-10-CM

## 2019-07-25 DIAGNOSIS — D219 Benign neoplasm of connective and other soft tissue, unspecified: Secondary | ICD-10-CM

## 2019-07-25 DIAGNOSIS — O09521 Supervision of elderly multigravida, first trimester: Secondary | ICD-10-CM | POA: Diagnosis not present

## 2019-07-25 DIAGNOSIS — O039 Complete or unspecified spontaneous abortion without complication: Secondary | ICD-10-CM | POA: Diagnosis not present

## 2019-07-25 LAB — CBC
HCT: 34.4 % — ABNORMAL LOW (ref 36.0–46.0)
Hemoglobin: 11.7 g/dL — ABNORMAL LOW (ref 12.0–15.0)
MCH: 32.1 pg (ref 26.0–34.0)
MCHC: 34 g/dL (ref 30.0–36.0)
MCV: 94.2 fL (ref 80.0–100.0)
Platelets: 200 10*3/uL (ref 150–400)
RBC: 3.65 MIL/uL — ABNORMAL LOW (ref 3.87–5.11)
RDW: 13 % (ref 11.5–15.5)
WBC: 4.1 10*3/uL (ref 4.0–10.5)
nRBC: 0 % (ref 0.0–0.2)

## 2019-07-25 LAB — URINALYSIS, ROUTINE W REFLEX MICROSCOPIC
Bacteria, UA: NONE SEEN
Bilirubin Urine: NEGATIVE
Glucose, UA: NEGATIVE mg/dL
Ketones, ur: NEGATIVE mg/dL
Leukocytes,Ua: NEGATIVE
Nitrite: NEGATIVE
Protein, ur: NEGATIVE mg/dL
Specific Gravity, Urine: 1.013 (ref 1.005–1.030)
pH: 7 (ref 5.0–8.0)

## 2019-07-25 LAB — HCG, QUANTITATIVE, PREGNANCY: hCG, Beta Chain, Quant, S: 17965 m[IU]/mL — ABNORMAL HIGH (ref <5)

## 2019-07-25 MED ORDER — IBUPROFEN 600 MG PO TABS: 600.0000 mg | ORAL_TABLET | Freq: Four times a day (QID) | ORAL | 3 refills | Status: AC | PRN

## 2019-07-25 MED ORDER — PROMETHAZINE HCL 25 MG PO TABS: 12.5000 mg | ORAL_TABLET | ORAL | 0 refills | Status: AC | PRN

## 2019-07-25 MED ORDER — MISOPROSTOL 200 MCG PO TABS
800.0000 ug | ORAL_TABLET | Freq: Once | ORAL | Status: AC
  Administered 2019-07-25: 800 ug via VAGINAL
  Filled 2019-07-25: qty 4

## 2019-07-25 MED ORDER — HYDROCODONE-ACETAMINOPHEN 5-325 MG PO TABS: 1.0000 | ORAL_TABLET | ORAL | 0 refills | Status: AC | PRN

## 2019-07-25 MED ORDER — IBUPROFEN 600 MG PO TABS
600.0000 mg | ORAL_TABLET | Freq: Once | ORAL | Status: AC
  Administered 2019-07-25: 600 mg via ORAL
  Filled 2019-07-25: qty 1

## 2019-07-25 NOTE — MAU Provider Note (Addendum)
History     CSN: [id number]  Arrival date and time: 07/25/19 [id number]   First Provider Initiated Contact with Patient 07/25/19 1013      Chief Complaint  Patient presents with  . Vaginal Bleeding   HPI Dominique Clements is a 38 y.o. [id number] at [redacted]w[redacted]d who presents to MAU with chief complaint of heavy vaginal bleeding with multiple clots. Patient states she has experienced light spotting throughout her pregnancy but this is her first episode of heavy vaginal bleeding. She reports intermittent LLQ abdominal pain, onset this morning, rated as 2/10. Her pain is non-radiating. She denies aggravating or alleviating factors. She has not taken medication or tried other treatments for this complaint.  She is remote from intercourse. S/p new OB appt at [facility] 12/03  OB History    Gravida  8   Para  4   Term  4   Preterm  0   AB  3   Living  4     SAB  3   TAB  0   Ectopic  0   Multiple  0   Live Births  4           Past Medical History:  Diagnosis Date  . Fibroid    uterine    Past Surgical History:  Procedure Laterality Date  . NO PAST SURGERIES      Family History  Problem Relation Age of Onset  . Cardiomyopathy Mother     Social History   Tobacco Use  . Smoking status: Never Smoker  . Smokeless tobacco: Never Used  Substance Use Topics  . Alcohol use: No  . Drug use: No    Allergies: No Known Allergies  Medications Prior to Admission  Medication Sig Dispense Refill Last Dose  . aspirin EC 81 MG tablet Take 1 tablet (81 mg total) by mouth daily. 90 tablet 1   . ibuprofen (ADVIL,MOTRIN) 800 MG tablet Take 1 tablet (800 mg total) by mouth every 8 (eight) hours as needed. (Patient not taking: Reported on 07/21/2019) 40 tablet 1   . oxyCODONE-acetaminophen (PERCOCET/ROXICET) 5-325 MG per tablet Take 1-2 tablets by mouth every 6 (six) hours as needed for severe pain. (Patient not taking: Reported on 07/21/2019) 15 tablet 0   . Prenatal Vit-Fe Fumarate-FA  (PRENATAL MULTIVITAMIN) TABS tablet Take 1 tablet by mouth daily at 12 noon. 30 tablet 3     Review of Systems  Constitutional: Negative for chills, fatigue and fever.  Respiratory: Negative for shortness of breath.   Gastrointestinal: Positive for abdominal pain.  Genitourinary: Positive for vaginal bleeding. Negative for difficulty urinating and dysuria.  Musculoskeletal: Negative for back pain.  Neurological: Negative for dizziness, syncope, weakness and headaches.  All other systems reviewed and are negative.  Physical Exam   Blood pressure 119/66, pulse 71, resp. rate 16, height 5' 8.5" (1.74 m), weight 83.5 kg, last menstrual period 04/26/2019, SpO2 100 %, unknown if currently breastfeeding.  Physical Exam  Nursing note and vitals reviewed. Constitutional: She is oriented to person, place, and time. She appears well-developed and well-nourished.  Cardiovascular: Normal rate.  Respiratory: Effort normal and breath sounds normal.  GI: Soft. She exhibits no distension. There is no abdominal tenderness. There is no rebound, no guarding and no CVA tenderness.  Genitourinary:    Genitourinary Comments: Moderate dark red bleeding visible in vaginal vault. Removed with fox swab x 2. Scant bleeding afterwards. No sign of injury or trauma.   Neurological: She is alert and  oriented to person, place, and time.  Skin: Skin is warm and dry.  Psychiatric: She has a normal mood and affect. Her behavior is normal. Judgment and thought content normal.    MAU Course/MDM  Procedures: sterile speculum exam, ultrasound   Patient Vitals for the past 24 hrs:  BP Pulse Resp SpO2 Height Weight  07/25/19 1336 123/67 - - - - -  07/25/19 0955 119/66 71 16 100 % 5' 8.5" (1.74 m) 83.5 kg   Results for orders placed or performed during the hospital encounter of 07/25/19 (from the past 24 hour(s))  Urinalysis, Routine w reflex microscopic     Status: Abnormal   Collection Time: 07/25/19 10:20 AM   Result Value Ref Range   Color, Urine YELLOW YELLOW   APPearance CLEAR CLEAR   Specific Gravity, Urine 1.013 1.005 - 1.030   pH 7.0 5.0 - 8.0   Glucose, UA NEGATIVE NEGATIVE mg/dL   Hgb urine dipstick LARGE (A) NEGATIVE   Bilirubin Urine NEGATIVE NEGATIVE   Ketones, ur NEGATIVE NEGATIVE mg/dL   Protein, ur NEGATIVE NEGATIVE mg/dL   Nitrite NEGATIVE NEGATIVE   Leukocytes,Ua NEGATIVE NEGATIVE   RBC / HPF 0-5 0 - 5 RBC/hpf   WBC, UA 0-5 0 - 5 WBC/hpf   Bacteria, UA NONE SEEN NONE SEEN   Squamous Epithelial / LPF 0-5 0 - 5  CBC     Status: Abnormal   Collection Time: 07/25/19 10:34 AM  Result Value Ref Range   WBC 4.1 4.0 - 10.5 K/uL   RBC 3.65 (L) 3.87 - 5.11 MIL/uL   Hemoglobin 11.7 (L) 12.0 - 15.0 g/dL   HCT 34.4 (L) 36.0 - 46.0 %   MCV 94.2 80.0 - 100.0 fL   MCH 32.1 26.0 - 34.0 pg   MCHC 34.0 30.0 - 36.0 g/dL   RDW 13.0 11.5 - 15.5 %   Platelets 200 150 - 400 K/uL   nRBC 0.0 0.0 - 0.2 %  hCG, quantitative, pregnancy     Status: Abnormal   Collection Time: 07/25/19 10:34 AM  Result Value Ref Range   hCG, Beta Chain, Quant, S 17,965 (H) <5 mIU/mL   US Ob Less Than 14 Weeks With Ob Transvaginal  Result Date: 07/25/2019 CLINICAL DATA:  Episode of heavy vaginal bleeding, passing heavy clots today, [redacted] weeks pregnant EXAM: OBSTETRIC <14 WK [place] AND TRANSVAGINAL OB US TECHNIQUE: Both transabdominal and transvaginal ultrasound examinations were performed for complete evaluation of the gestation as well as the maternal uterus, adnexal regions, and pelvic cul-de-sac. Transvaginal technique was performed to assess early pregnancy. COMPARISON:  None FINDINGS: Intrauterine gestational sac: Present, single, irregular Yolk sac:  Not visualized Embryo:  Not visualized Cardiac Activity: N/A Heart Rate: N/A  bpm MSD: 33.6 mm   8 w   3 d CRL:    mm    w    d                  [place] EDC: Subchorionic hemorrhage:  None visualized. Maternal uterus/adnexae: Small uterine leiomyoma intramural posterior 2.9 x  3.3 x 2.8 cm. RIGHT ovary normal size and morphology, 4.1 x 2.1 x 2.3 cm, with small corpus luteal cyst. LEFT ovary normal size and morphology, 1.2 x 2.2 x 1.3 cm. No free pelvic fluid or adnexal masses. IMPRESSION: Gestational sac is identified within uterus, single, irregular, lacking a yolk sac and fetal pole. Findings meet definitive criteria for failed pregnancy. This follows SRU consensus guidelines: Diagnostic Criteria for Nonviable  Pregnancy Early in the First Trimester. Alison Stalling J Med [phone]. Small intramural leiomyoma posterior mid uterus 3.3 cm greatest diameter. Electronically Signed   By: Lavonia Dana M.D.   On: 07/25/2019 12:03    --FHT 154 bpm at new ob appt 07/21/19. No evidence of fetus on ultrasound today. --Following consult with Dr. Elly Modena, discussed expectant management vs. Cytotec with patient and partner (via phone).  Discussed possible timelines, plans of care, concerning symptoms for which to monitor with either decision. Patient verbalizes preference for Cytotec administration in MAU. --Patient declined offer of chaplain  Early Intrauterine Pregnancy Failure  X  Documented intrauterine pregnancy failure less than or equal to [redacted] weeks gestation  X  No serious current illness  X  Baseline Hgb greater than or equal to 10g/dl  X  Patient has easily accessible transportation to the hospital  X  Clear preference  X  Practitioner/physician deems patient reliable  X  Counseling by practitioner or physician  X  Patient education by RN  N/A (O POS)  Rho-Gam given by RN if indicated  X  Medication dispensed: Cytotec 800 mcg, Intravaginally by RN in MAU       X  Ibuprofen 600 mg 1 tablet by mouth every 6 hours as needed #30  X  Hydrocodone/acetaminophen 5/325 mg by mouth every 4 to 6 hours as needed  X  Phenergan 12.5 mg by mouth every 4 hours as needed for nausea  Meds ordered this encounter  Medications  . misoprostol (CYTOTEC) tablet 800 mcg  . ibuprofen  (ADVIL) tablet 600 mg  . ibuprofen (ADVIL) 600 MG tablet    Sig: Take 1 tablet (600 mg total) by mouth every 6 (six) hours as needed.    Dispense:  60 tablet    Refill:  3    Order Specific Question:   Supervising Provider    Answer:   CONSTANT, PEGGY [[id number]]  . HYDROcodone-acetaminophen (NORCO/VICODIN) 5-325 MG tablet    Sig: Take 1-2 tablets by mouth every 4 (four) hours as needed for up to 3 days for severe pain.    Dispense:  6 tablet    Refill:  0    Order Specific Question:   Supervising Provider    Answer:   CONSTANT, PEGGY [[id number]]  . promethazine (PHENERGAN) 25 MG tablet    Sig: Take 0.5 tablets (12.5 mg total) by mouth every 4 (four) hours as needed for nausea or vomiting.    Dispense:  30 tablet    Refill:  0    Order Specific Question:   Supervising Provider    Answer:   CONSTANT, PEGGY [[id number]]   Assessment and Plan  --38 y.o. [id number] with first trimester miscarriage --S/p Cytotec administration in MAU --Discharge home in stable condition with bleeding and sepsis precautions  F/U (message sent to clinic): --[facility] repeat Quant in one week --[facility] Provider visit in two weeks  Darlina Rumpf, CNM 07/25/2019, 2:37 PM

## 2019-07-25 NOTE — Discharge Instructions (Signed)

## 2019-07-25 NOTE — MAU Note (Addendum)
Pt is G8P4 at [redacted]w[redacted]D who presents for increased bleeding w/clots this morning. She noticed this this morning at 730 when she woke up.  She states she had spotting throughout pregnancy. Had one previous episode of heavier bleeding which resolved and did not involve clots or tissue per patient.  Has slight abdominal cramping 2/10.

## 2019-08-01 ENCOUNTER — Telehealth (INDEPENDENT_AMBULATORY_CARE_PROVIDER_SITE_OTHER): Payer: BC Managed Care – PPO | Admitting: Lactation Services

## 2019-08-01 ENCOUNTER — Other Ambulatory Visit: Payer: Self-pay

## 2019-08-01 ENCOUNTER — Encounter: Payer: Self-pay | Admitting: General Practice

## 2019-08-01 ENCOUNTER — Other Ambulatory Visit: Payer: BC Managed Care – PPO

## 2019-08-01 ENCOUNTER — Other Ambulatory Visit: Payer: Self-pay | Admitting: *Deleted

## 2019-08-01 DIAGNOSIS — O039 Complete or unspecified spontaneous abortion without complication: Secondary | ICD-10-CM

## 2019-08-01 NOTE — Telephone Encounter (Signed)
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 Informed pt that it would be recommended that she contact [facility] to schedule an appt with a Dietitian to discuss, especially if she is planning to get pregnant again. Pt would like to see reports today when she comes in for her labs. Pt miscarried infant.

## 2019-08-02 LAB — BETA HCG QUANT (REF LAB): hCG Quant: 208 m[IU]/mL

## 2019-08-09 ENCOUNTER — Other Ambulatory Visit: Payer: Self-pay

## 2019-08-09 ENCOUNTER — Encounter: Payer: Self-pay | Admitting: Obstetrics and Gynecology

## 2019-08-09 ENCOUNTER — Telehealth (INDEPENDENT_AMBULATORY_CARE_PROVIDER_SITE_OTHER): Payer: BC Managed Care – PPO | Admitting: Obstetrics and Gynecology

## 2019-08-09 VITALS — BP 104/74 | HR 76 | Wt 183.0 lb

## 2019-08-09 DIAGNOSIS — N939 Abnormal uterine and vaginal bleeding, unspecified: Secondary | ICD-10-CM | POA: Diagnosis not present

## 2019-08-09 DIAGNOSIS — O039 Complete or unspecified spontaneous abortion without complication: Secondary | ICD-10-CM | POA: Diagnosis not present

## 2019-08-09 NOTE — Progress Notes (Signed)
Subjective:  Dominique Clements is a 38 y.o. [id number] at [redacted]w[redacted]d who presents today for FU BHCG. She was seen on 07/25/2019 in [facility]. She is here for f/u.  Results from that day show no IUP on [place], and HCG 17, 965. HCG on 12/14 show 208.   She reports vaginal bleeding; brown in color. She denies abdominal or pelvic pain. Patient is tearful today.   Objective:  Physical Exam  Nursing note and vitals reviewed. Constitutional: She is oriented to person, place, and time. She appears well-developed and well-nourished. No distress.  HENT:  Head: Normocephalic.  Cardiovascular: Normal rate.  Respiratory: Effort normal.  GI: Soft. There is no tenderness.  GU: Scant amount of brown vaginal discharge.  Neurological: She is alert and oriented to person, place, and time. Skin: Skin is warm and dry.  Psychiatric: She has a normal mood and affect.   Results for orders placed or performed in visit on 08/09/19 (from the past 24 hour(s))  Beta hCG quant (ref lab)     Status: None   Collection Time: 08/09/19  5:00 PM  Result Value Ref Range   hCG Quant 25 mIU/mL   Narrative   Performed at:  [facility] [address], [place], [place]  [id number] Lab Director: Rush Farmer MD, Phone:  [phone]  CBC     Status: Abnormal   Collection Time: 08/09/19  5:00 PM  Result Value Ref Range   WBC 4.9 3.4 - 10.8 x10E3/uL   RBC 3.65 (L) 3.77 - 5.28 x10E6/uL   Hemoglobin 11.8 11.1 - 15.9 g/dL   Hematocrit 34.8 34.0 - 46.6 %   MCV 95 79 - 97 fL   MCH 32.3 26.6 - 33.0 pg   MCHC 33.9 31.5 - 35.7 g/dL   RDW 13.0 11.7 - 15.4 %   Platelets 252 150 - 450 x10E3/uL   Narrative   Performed at:  [address], [place], [place]  [id number] Lab Director: Rush Farmer MD, Phone:  [phone]    Assessment/Plan: SAB; Quant dropped from [id number] Minimal brown spotting on exam.  FU in 1 week for Quant.  Patient with concerns regarding her care in the office. Dominique Clements to see the  patient in the office today.  Will call the patient with the results of her blood work today Support given.    Dominique Lye, NP 08/10/2019 3:18 PM

## 2019-08-10 LAB — CBC
Hematocrit: 34.8 % (ref 34.0–46.6)
Hemoglobin: 11.8 g/dL (ref 11.1–15.9)
MCH: 32.3 pg (ref 26.6–33.0)
MCHC: 33.9 g/dL (ref 31.5–35.7)
MCV: 95 fL (ref 79–97)
Platelets: 252 10*3/uL (ref 150–450)
RBC: 3.65 x10E6/uL — ABNORMAL LOW (ref 3.77–5.28)
RDW: 13 % (ref 11.7–15.4)
WBC: 4.9 10*3/uL (ref 3.4–10.8)

## 2019-08-10 LAB — BETA HCG QUANT (REF LAB): hCG Quant: 25 m[IU]/mL

## 2019-08-16 ENCOUNTER — Telehealth: Payer: Self-pay | Admitting: *Deleted

## 2019-08-16 NOTE — Telephone Encounter (Addendum)
-----   Message from [name], NP sent at 08/16/2019  8:54 AM EST ----- Can you please call the patient and let her know that she will need another quant this week in the office. I attempted to call her several times last week but I could not get through. Please let her know the levels have dropped significantly. Thank you. Dominique Clements.  12/29  1030 Called pt and informed her of BHCG results. I advised her that Dominique Clements is recommending a repeat test of the level this week and also that we will follow the level weekly until it is <5. Pt stated that she understood from [facility] that if the level was not @ zero, she would order an ultrasound as well as repeat lab draw. I stated that the message to clinical staff only stated that she needed lab draw due to appropriate drop in the level of hormone. Pt voiced understanding and agreed to appt for lab tomorrow @ 1350.

## 2019-08-17 ENCOUNTER — Other Ambulatory Visit: Payer: BC Managed Care – PPO

## 2019-08-17 ENCOUNTER — Other Ambulatory Visit: Payer: Self-pay | Admitting: *Deleted

## 2019-08-17 ENCOUNTER — Other Ambulatory Visit: Payer: Self-pay

## 2019-08-17 DIAGNOSIS — O039 Complete or unspecified spontaneous abortion without complication: Secondary | ICD-10-CM

## 2019-08-18 ENCOUNTER — Encounter: Payer: BC Managed Care – PPO | Admitting: Family Medicine

## 2019-08-18 LAB — BETA HCG QUANT (REF LAB): hCG Quant: 8 m[IU]/mL

## 2019-08-21 ENCOUNTER — Telehealth: Payer: Self-pay | Admitting: Obstetrics and Gynecology

## 2019-08-21 NOTE — Telephone Encounter (Signed)
Called Dominique Clements to review recent Hcg levels. All questions answered. Dominique Clements voiced appreciation for the call. She will call the office if needed in the future.   [name], NP 08/21/2019 10:43 AM

## 2019-08-23 ENCOUNTER — Encounter: Payer: Self-pay | Admitting: *Deleted

## 2019-08-24 ENCOUNTER — Other Ambulatory Visit: Payer: BC Managed Care – PPO

## 2019-09-06 ENCOUNTER — Other Ambulatory Visit (HOSPITAL_COMMUNITY): Payer: Self-pay

## 2019-09-06 ENCOUNTER — Ambulatory Visit (HOSPITAL_COMMUNITY): Payer: Self-pay

## 2019-12-10 IMAGING — US US OB < 14 WEEKS - US OB TV
1 series · 15 of 28 positions shown · non-contrast
Comparison: None

CLINICAL DATA: Episode of heavy vaginal bleeding, passing heavy
clots today, 12 weeks pregnant

EXAM:
OBSTETRIC <14 WK US AND TRANSVAGINAL OB US
TECHNIQUE: Both transabdominal and transvaginal ultrasound examinations were
performed for complete evaluation of the gestation as well as the
maternal uterus, adnexal regions, and pelvic cul-de-sac.
Transvaginal technique was performed to assess early pregnancy.

[Series 1: us ob < 14 weeks - us ob tv · 98 acquisitions, 15 frames shown]
[im 1/98]
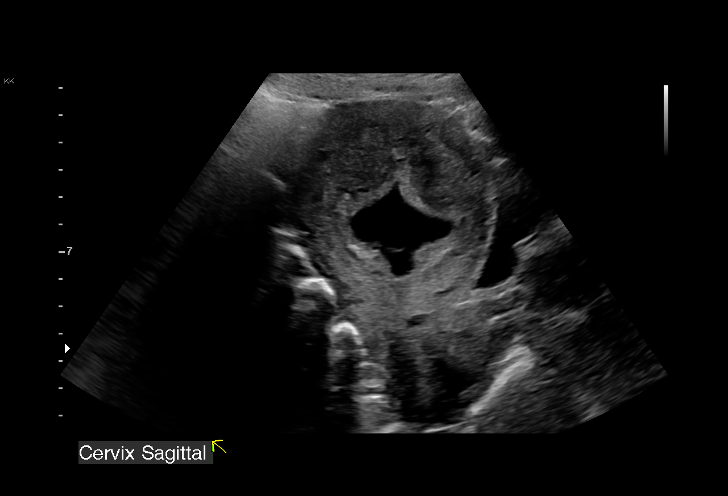
[im 8/98]
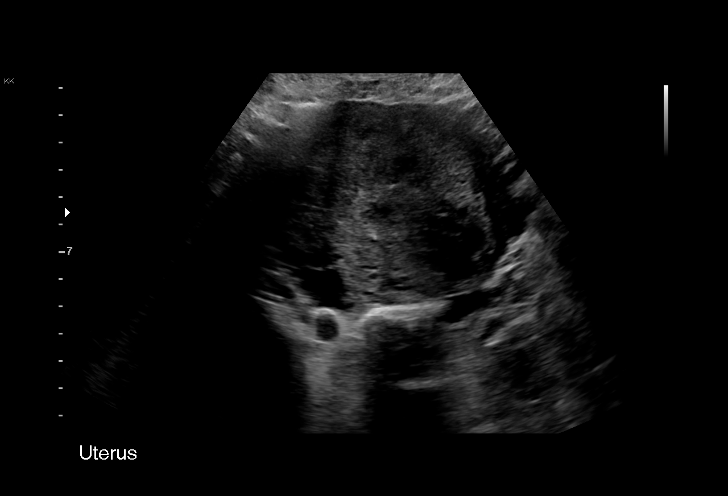
[im 15/98]
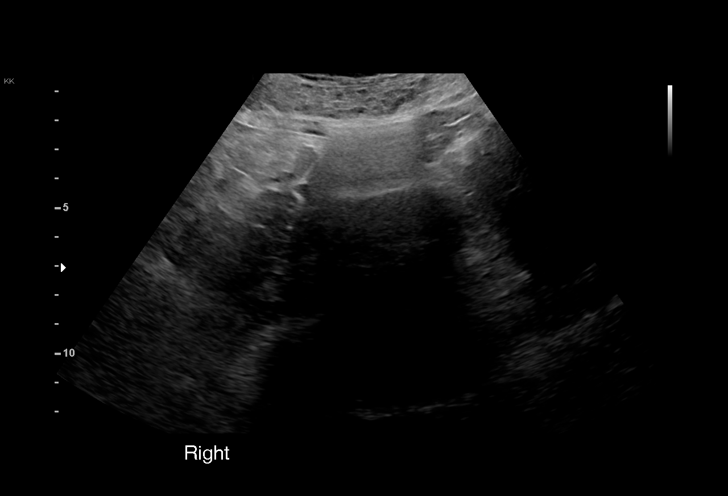
[im 22/98]
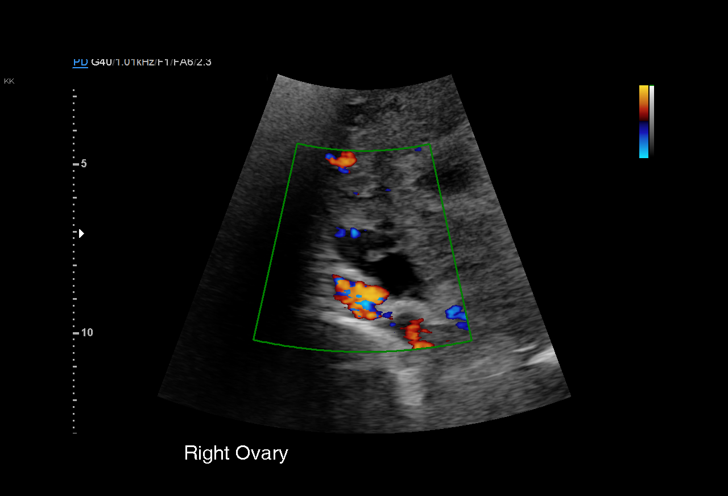
[im 29/98]
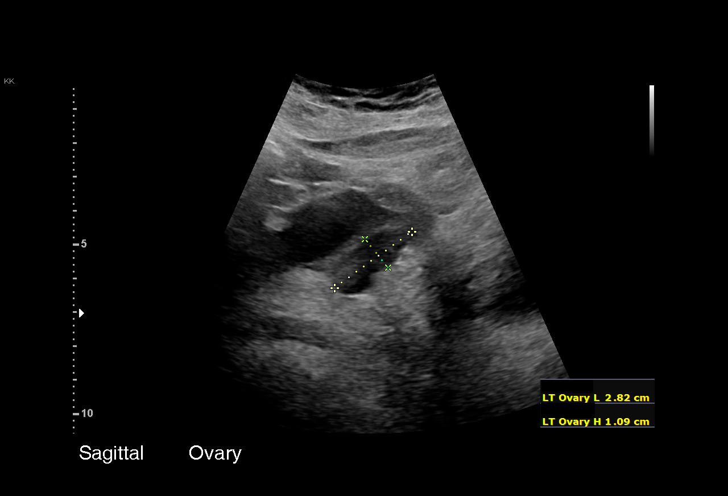
[im 36/98]
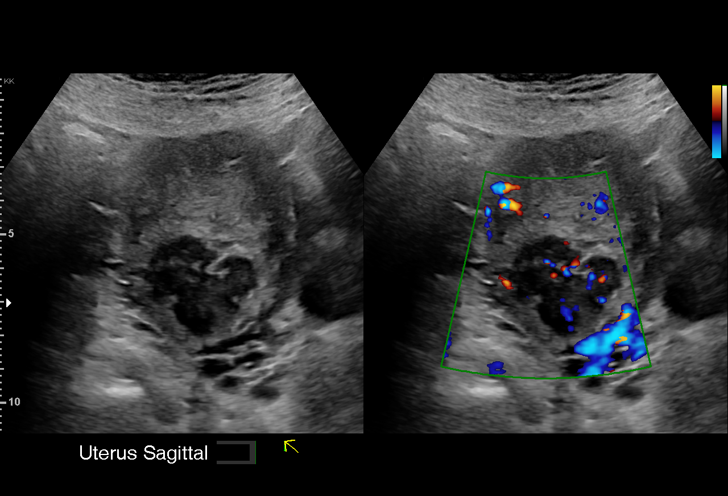
[im 44/98]
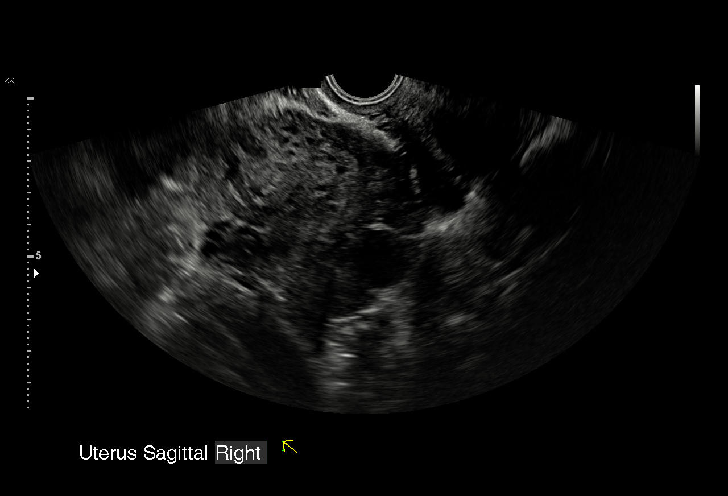
[im 51/98]
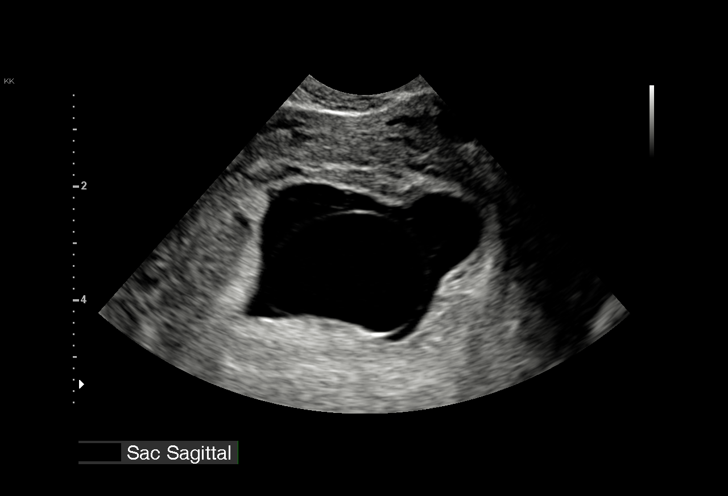
[im 54/98]
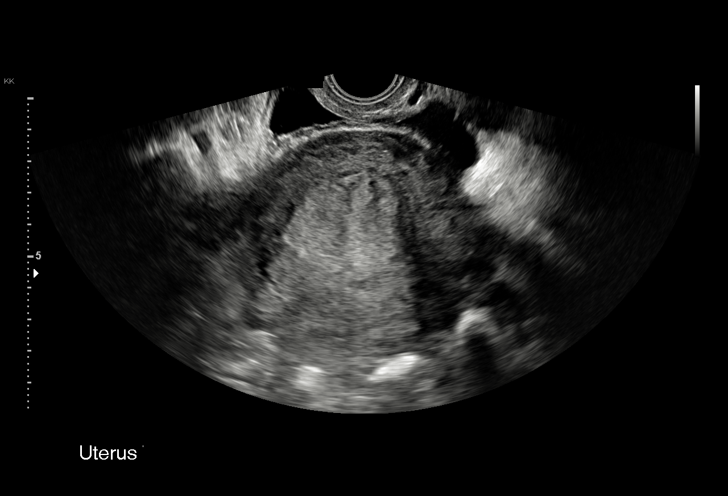
[im 62/98]
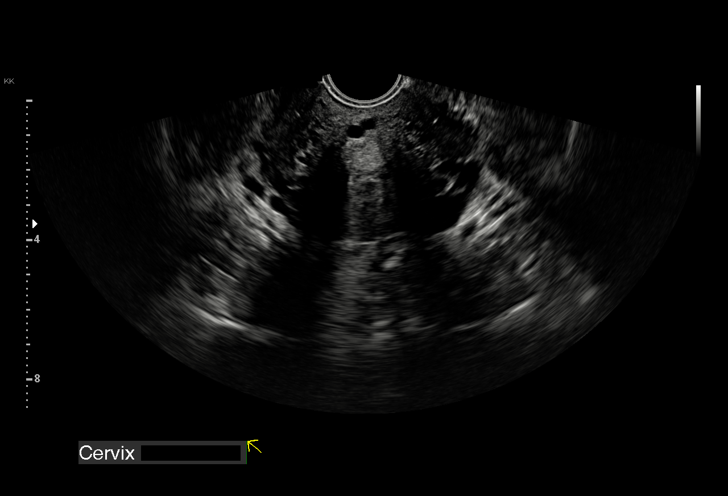
[im 69/98]
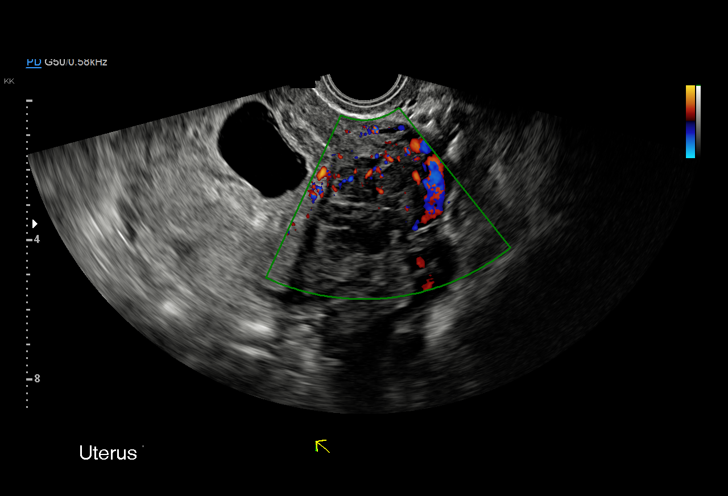
[im 76/98]
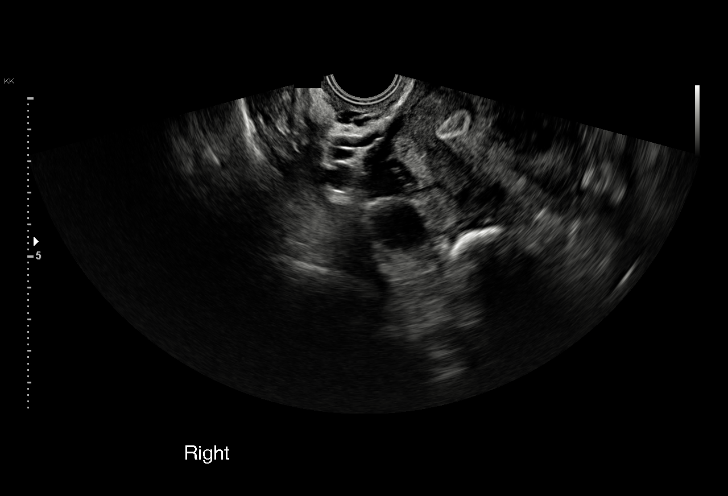
[im 83/98]
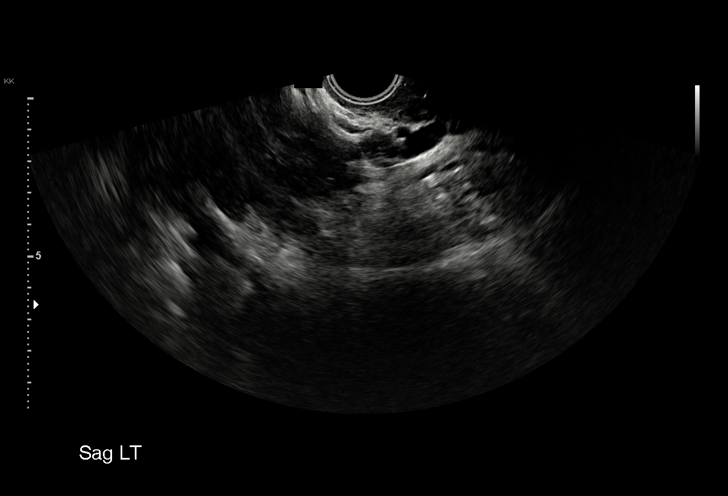
[im 90/98]
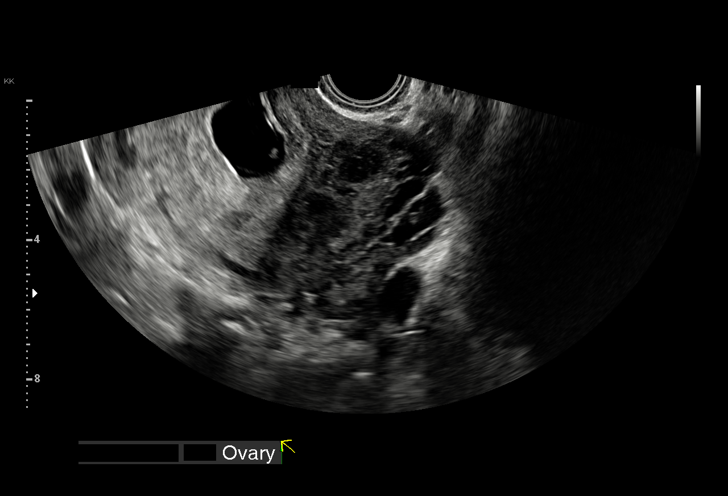
[im 98/98]
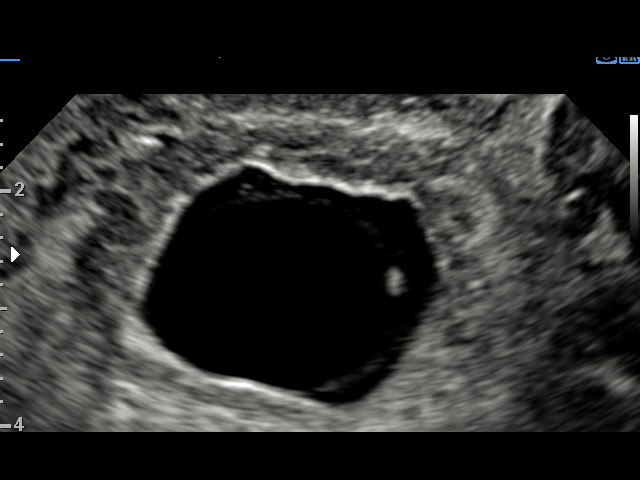

[15 of 28 positions shown; findings below may reference images not displayed]

FINDINGS: Intrauterine gestational sac: Present, single, irregular

Yolk sac:  Not visualized

Embryo:  Not visualized

Cardiac Activity: N/A

Heart Rate: N/A  bpm

MSD: 33.6 mm   8 w   3 d

CRL:    mm    w    d                  US EDC:

Subchorionic hemorrhage:  None visualized.

Maternal uterus/adnexae:

Small uterine leiomyoma intramural posterior 2.9 x 3.3 x 2.8 cm.

RIGHT ovary normal size and morphology, 4.1 x 2.1 x 2.3 cm, with
small corpus luteal cyst.

LEFT ovary normal size and morphology, 1.2 x 2.2 x 1.3 cm.

No free pelvic fluid or adnexal masses.
IMPRESSION: Gestational sac is identified within uterus, single, irregular,
lacking a yolk sac and fetal pole.

Findings meet definitive criteria for failed pregnancy. This follows
SRU consensus guidelines: Diagnostic Criteria for Nonviable
Pregnancy Early in the First Trimester. N Engl J Med
4599;[DATE].

Small intramural leiomyoma posterior mid uterus 3.3 cm greatest
diameter.

## 2022-03-07 ENCOUNTER — Encounter (HOSPITAL_COMMUNITY): Payer: Self-pay

## 2022-03-07 ENCOUNTER — Ambulatory Visit (HOSPITAL_COMMUNITY)
Admission: EM | Admit: 2022-03-07 | Discharge: 2022-03-07 | Disposition: A | Discharge status: Home / Self Care | Payer: BC Managed Care – PPO | Attending: Emergency Medicine | Admitting: Emergency Medicine

## 2022-03-07 DIAGNOSIS — H109 Unspecified conjunctivitis: Secondary | ICD-10-CM | POA: Diagnosis not present

## 2022-03-07 DIAGNOSIS — B9689 Other specified bacterial agents as the cause of diseases classified elsewhere: Secondary | ICD-10-CM | POA: Diagnosis not present

## 2022-03-07 DIAGNOSIS — J029 Acute pharyngitis, unspecified: Secondary | ICD-10-CM | POA: Insufficient documentation

## 2022-03-07 LAB — POCT RAPID STREP A, ED / UC: Streptococcus, Group A Screen (Direct): NEGATIVE

## 2022-03-07 MED ORDER — MOXIFLOXACIN HCL 0.5 % OP SOLN: 1.0000 [drp] | Freq: Three times a day (TID) | OPHTHALMIC | 0 refills | Status: AC

## 2022-03-07 MED ORDER — LIDOCAINE VISCOUS HCL 2 % MT SOLN: 15.0000 mL | OROMUCOSAL | 0 refills | Status: AC | PRN

## 2022-03-07 NOTE — ED Triage Notes (Signed)
Pt presents with sore throat and eye redness and pain x 2-3 days.

## 2022-03-07 NOTE — Discharge Instructions (Signed)
Today you being treated for bacterial conjunctivitis.   Place 1 drop into each eye every 8 hours for the next 7 days  May use cool compress for comfort and to remove discharge if present. Pat the eye, do not wipe.  Do not rub eyes, this may cause more irritation.  May use benadryl as needed to help if itching present.  Please avoid use of eye makeup until symptoms clear.  If symptoms persist after use of medication, please follow up at Urgent Care or with ophthalmologist (eye doctor)    Your symptoms today are most likely being caused by a virus and should steadily improve in time it can take up to 7 to 10 days before you truly start to see a turnaround however things will get better  Strep test is negative for bacteria  You may gargle and spit lidocaine solution every 4 hours to help assist with discomfort   you can take Tylenol and/or Ibuprofen as needed for fever reduction and pain relief.  For sore throat: try warm salt water gargles, cepacol lozenges, throat spray, warm tea or water with lemon/honey, popsicles or ice, or OTC cold relief medicine for throat discomfort.   It is important to stay hydrated: drink plenty of fluids (water, gatorade/powerade/pedialyte, juices, or teas) to keep your throat moisturized and help further relieve irritation/discomfort.

## 2022-03-07 NOTE — ED Provider Notes (Signed)
[place] [facility]    CSN: [id number] Arrival date & time: 03/07/22  5631      History   Chief Complaint Chief Complaint  Patient presents with   Eye Drainage   Sore Throat    HPI Dominique Clements is a 41 y.o. female.   Patient presents with sore throat and bilateral eye itching, drainage with crusting, irritation and erythema for 3 days.  Has been painful to swallow but tolerating fluids.  Known sick contact for conjunctivitis.  Denies fever, chills, body aches, nasal congestion, ear pain, coughing, shortness of breath, wheezing, abdominal pain, nausea, vomiting or diarrhea.  Has attempted use of Polytrim for treatment as well as warm teas which have been ineffective.  Does not wear contacts.   Past Medical History:  Diagnosis Date   Fibroid    uterine   Normal labor and delivery 04/13/2014   Supervision of high risk pregnancy, antepartum 07/07/2019    Nursing Staff Provider Office Location  [facility] Dating  LMP Language   English Anatomy US   Flu Vaccine  Decline Genetic Screen  NIPS:    AFP:   TDaP vaccine   '@28w'$  Hgb A1C or  GTT A1C: 5.7 Third trimester  Rhogam  NA   LAB RESULTS  Feeding Plan breast Blood Type O/Positive/-- (12/03 1233)  Contraception IUD Antibody Negative (12/03 1233) Circumcision yes Rubella 1.33 (12/03 1233) Pediatrician      Patient Active Problem List   Diagnosis Date Noted   Miscarriage 07/25/2019   Elevated hemoglobin A1c 07/23/2019   AMA (advanced maternal age) multigravida 35+ 07/07/2019   Fibroid    Precipitous delivery 04/13/2014    Past Surgical History:  Procedure Laterality Date   NO PAST SURGERIES      OB History     Gravida  8   Para  4   Term  4   Preterm  0   AB  3   Living  4      SAB  3   IAB  0   Ectopic  0   Multiple  0   Live Births  4            Home Medications    Prior to Admission medications   Medication Sig Start Date End Date Taking? Authorizing Provider  ibuprofen (ADVIL) 600 MG  tablet Take 1 tablet (600 mg total) by mouth every 6 (six) hours as needed. Patient not taking: Reported on 08/09/2019 07/25/19   Darlina Rumpf, CNM  promethazine (PHENERGAN) 25 MG tablet Take 0.5 tablets (12.5 mg total) by mouth every 4 (four) hours as needed for nausea or vomiting. Patient not taking: Reported on 08/09/2019 07/25/19   Darlina Rumpf, CNM    Family History Family History  Problem Relation Age of Onset   Cardiomyopathy Mother     Social History Social History   Tobacco Use   Smoking status: Never   Smokeless tobacco: Never  Vaping Use   Vaping Use: Never used  Substance Use Topics   Alcohol use: No   Drug use: No     Allergies   Patient has no known allergies.   Review of Systems Review of Systems  Constitutional: Negative.   HENT:  Positive for sore throat. Negative for congestion, dental problem, drooling, ear discharge, ear pain, facial swelling, hearing loss, mouth sores, nosebleeds, postnasal drip, rhinorrhea, sinus pressure, sinus pain, sneezing, tinnitus, trouble swallowing and voice change.   Eyes:  Positive for pain, discharge, redness  and itching. Negative for photophobia and visual disturbance.  Respiratory: Negative.    Cardiovascular: Negative.   Skin: Negative.   Neurological: Negative.      Physical Exam Triage Vital Signs ED Triage Vitals [03/07/22 0825]  Enc Vitals Group     BP 115/67     Pulse Rate 85     Resp 16     Temp 98.1 F (36.7 C)     Temp Source Oral     SpO2 94 %     Weight      Height      Head Circumference      Peak Flow      Pain Score      Pain Loc      Pain Edu?      Excl. in [place]?    No data found.  Updated Vital Signs BP 115/67 (BP Location: Left Arm)   Pulse 85   Temp 98.1 F (36.7 C) (Oral)   Resp 16   SpO2 94%   Visual Acuity Right Eye Distance:   Left Eye Distance:   Bilateral Distance:    Right Eye Near:   Left Eye Near:    Bilateral Near:     Physical  Exam Constitutional:      Appearance: Normal appearance. She is well-developed.  HENT:     Head: Normocephalic.     Right Ear: Tympanic membrane and ear canal normal.     Left Ear: Tympanic membrane and ear canal normal.     Nose: No congestion or rhinorrhea.     Mouth/Throat:     Mouth: Mucous membranes are moist.     Pharynx: Oropharynx is clear. No posterior oropharyngeal erythema.     Tonsils: No tonsillar exudate. 0 on the right. 0 on the left.  Eyes:     Comments: Erythema noted to the bilateral conjunctiva, no swelling or drainage noted, vision is grossly intact, extraocular movements intact  Cardiovascular:     Rate and Rhythm: Normal rate and regular rhythm.     Pulses: Normal pulses.     Heart sounds: Normal heart sounds.  Pulmonary:     Effort: Pulmonary effort is normal.     Breath sounds: Normal breath sounds.  Musculoskeletal:     Cervical back: Normal range of motion.  Lymphadenopathy:     Cervical: Cervical adenopathy present.  Skin:    General: Skin is warm and dry.  Neurological:     General: No focal deficit present.     Mental Status: She is alert and oriented to person, place, and time.  Psychiatric:        Mood and Affect: Mood normal.        Behavior: Behavior normal.      UC Treatments / Results  Labs (all labs ordered are listed, but only abnormal results are displayed) Labs Reviewed  CULTURE, GROUP A STREP [facility])  POCT RAPID STREP A, ED / UC    EKG   Radiology No results found.  Procedures Procedures (including critical care time)  Medications Ordered in UC Medications - No data to display  Initial Impression / Assessment and Plan / UC Course  I have reviewed the triage vital signs and the nursing notes.  Pertinent labs & imaging results that were available during my care of the patient were reviewed by me and considered in my medical decision making (see chart for details).  Viral pharyngitis Bacterial conjunctivitis of both  eyes  Vital signs are stable  and patient is in no signs of distress, no erythema, tonsillar adenopathy or exudate is noted to the oropharynx, strep test is negative, sent for culture, discussed all findings with patient, etiology is most likely viral, recommended supportive care and prescribed viscous lidocaine for additional management, may follow-up with urgent care as needed  Presentation of eyes is consistent with conjunctivitis with known exposure, as Polytrim has been ineffective, moxifloxacin prescribed, advised oral antihistamines for management of pruritus and cool compresses and Tylenol for management of discomfort, advised against eye touching or rubbing to prevent further irritation and spread, may follow-up with this urgent care if symptoms persist or worsen Final Clinical Impressions(s) / UC Diagnoses   Final diagnoses:  None   Discharge Instructions   None    ED Prescriptions   None    PDMP not reviewed this encounter.   Hans Eden, [place] 03/07/22 [phone]

## 2022-03-09 LAB — CULTURE, GROUP A STREP (THRC)
